# Patient Record
Sex: Male | Born: 1950 | Race: White | Hispanic: No | Marital: Married
Health system: Midwestern US, Academic
[De-identification: ages and names within clinical notes are randomized; demographics above are authoritative.]

---

## 2007-07-30 NOTE — Unmapped (Signed)
[facility]     PATIENT NAME:   John Chapman, John Chapman                     MR #:  [id number]   DATE OF BIRTH:  03/08/1950                        ACCOUNT #:  [id number]   ED PHYSICIAN:   Martie Lee D. Etheleen Mayhew, M.D.            ROOM #:   PRIMARY:        Waverly Ferrari. Laurine Blazer, M.D.           NURSING UNIT:  ED   REFERRING:                                        FC:  C   DICTATED BY:    Martie Lee D. Etheleen Mayhew, M.D.            ADMIT DATE:  07/30/2007   VISIT DATE:     07/30/2007                        DISCHARGE DATE:                       EMERGENCY DEPARTMENT 24 HOUR FOLLOWUP NOTE       CHIEF COMPLAINT:  Rash.     HISTORY OF PRESENT ILLNESS:  The patient is a pleasant, 57 year old,   Caucasian male who presents to the ED complaining of a pruritic rash that   began shortly after he took a dose of amoxicillin this morning.  He has   recently been on amoxicillin for URI.  He took his last dose this morning   around 3:00 when he awoke and then shortly afterwards he broke out in hives.   He has never been on amoxicillin before that he recalls.     PAST MEDICAL HISTORY:     1.   Hypercholesterolemia.   2.   Gout.     MEDICATIONS:     1.   Lipitor.   2.   Amoxicillin.     ALLERGIES:     1.   Probably allergic to amoxicillin from the rash this morning.     SOCIAL HISTORY:  The patient does not smoke or drink any alcohol.  No   recreational drugs.  He is married.     FAMILY HISTORY:  Noncontributory.     REVIEW OF SYSTEMS:  As above.  The patient denies any shortness of breath or   wheezing.  No facial swelling or difficulty swallowing.  Other review of   systems are negative.   PHYSICAL EXAMINATION:     VITAL SIGNS:  Blood pressure 146/68, pulse 65, respirations 16, temperature,   please see nursing notes.   GENERAL:  This is a pleasant, ___, white male lying down who is awake and   alert.   SKIN:  Warm and dry.   RESPIRATORY:  No obvious respiratory distress.  There is no wheezing.   SKIN:  He does have a diffuse  urticarial rash most significantly on his   chest, abdomen and forearms.   HEENT:  PERRL.  EOMI.  Oropharynx is moist.  There is no swelling.   NECK:  Supple.  No JVD.  CARDIOVASCULAR:  Regular rate and rhythm.  S1 and S2, without murmur, rub or   gallop.   ABDOMEN:  Positive bowel sounds, soft, nontender.   EXTREMITIES:  Without edema.  Good distal pulses.   NEUROLOGIC:  Cranial nerves II through XII are intact.  Strength is 5/5 and   equal.  Nonfocal.     EMERGENCY DEPARTMENT COURSE:  In the emergency room, an IV was started.  The   patient received 50 mg of Benadryl IV as well as 120 mg Solu-Medrol and 20 mg   of Pepcid.  The patient will be monitored here in the ED to see if symptoms   improve with that.  If they do improve, I anticipate the patient will be able   to be discharged home on Benadryl as well as prednisone and Pepcid.  The   patient and his wife are planning on driving down to [place] today.  I have   informed him that he should not be driving if he is taking Benadryl.  His   wife states that she will be driving.  If he is to get worse, he is to be   evaluated at that time.  At the present time he does not show any signs of   any airway involvement.  He just has the urticarial rash.  I have also   informed him that he needs to let his doctor know and his pharmacy that he is   allergic to penicillin.  He agrees and understands.     DISPOSITION:  Home.     CONDITION:  Stable.                                                   _______________________________________   SDL/dla                                _____   D:  07/30/2007 07:13                  Augusta Mirkin D. Etheleen Mayhew, M.D.   T:  07/30/2007 12:17   Job #:  [id number]     c:   Waverly Ferrari. Laurine Blazer, M.D.                       EMERGENCY DEPARTMENT 24 HOUR FOLLOWUP NOTE                                        COPY                    PAGE    1 of   1                                        COPY                    PAGE    1 of   1

## 2014-02-04 ENCOUNTER — Ambulatory Visit: Admit: 2014-02-04 | Payer: PRIVATE HEALTH INSURANCE

## 2014-02-04 DIAGNOSIS — I10 Essential (primary) hypertension: Secondary | ICD-10-CM

## 2014-02-04 LAB — BASIC METABOLIC PANEL
Anion Gap: 6 mmol/L (ref 3–16)
BUN: 22 mg/dL (ref 7–25)
CO2: 28 mmol/L (ref 21–33)
Calcium: 9.7 mg/dL (ref 8.6–10.3)
Chloride: 103 mmol/L (ref 98–110)
Creatinine: 1.09 mg/dL (ref 0.60–1.30)
GFR MDRD Af Amer: 83 See note.
GFR MDRD Non Af Amer: 69 See note.
Glucose: 111 mg/dL (ref 70–100)
Osmolality, Calculated: 288 mOsm/kg (ref 278–305)
Potassium: 4.3 mmol/L (ref 3.5–5.3)
Sodium: 137 mmol/L (ref 133–146)

## 2014-02-04 LAB — CBC
Hematocrit: 45.9 % (ref 38.5–50.0)
Hemoglobin: 15.4 g/dL (ref 13.2–17.1)
MCH: 31.9 pg (ref 27.0–33.0)
MCHC: 33.7 g/dL (ref 32.0–36.0)
MCV: 94.8 fL (ref 80.0–100.0)
MPV: 7.2 fL (ref 7.5–11.5)
Platelets: 205 10*3/uL (ref 140–400)
RBC: 4.84 10*6/uL (ref 4.20–5.80)
RDW: 13.1 % (ref 11.0–15.0)
WBC: 5.1 10*3/uL (ref 3.8–10.8)

## 2014-02-04 NOTE — Unmapped (Signed)
Pre-Procedure Instructions    We???re pleased that you have chosen [facility] for your upcoming procedure.  The staff serving you is professionally trained to provide the highest quality care.  We encourage you to ask questions and to let the staff know your special needs.  We want your visit to be as comfortable as possible.    Your procedure is scheduled on 02/06/14 at 0730 AM.  Please arrive at 0530 AM and check in at the surgery waiting room on the second floor of the main hospital.    Directions to Surgery Area- do not stop at [employer] on Main Floor    1) Main entrance of the hospital to the elevators on the left (across from the gift shop)  2) Elevator to the second floor.  3) Left off elevator to the Surgical Services Desk.    FOLLOW YOUR PHYSICIAN'S INSTRUCTIONS CONCERNING DISCONTINUING ASPIRIN, NSAIDS (non-steroidal anti-inflammatories such as Ibuprofen, Advil and Naproxen), SUPPLEMENTS, FISH OIL, VITAMINS, AND HERBAL SUPPLEMENTS.  ACETAMINOPHEN (TYLENOL) IS OK.    ??? DO NOT EAT OR DRINK ANYTHING (including gum, mints, water, etc.) after midnight the night before your procedure.  You may brush your teeth and gargle on the morning of surgery, but do not swallow any water with the exception of the following medication: none (with a small sip of water).    ??? Please make transportation arrangements and bring a responsible adult to accompany you home and remain with you for 24 hours.  ??? We recommend that you leave valuables (i.e. money, jewelry, credit cards) at home.  If you wear glasses or contacts, bring a case for safekeeping.  ??? Wear casual, loose fitting, and comfortable clothing.  A gown will be provided.  If you are staying overnight, bring a small overnight bag.  (Storage space is limited.)  ??? Please remove all makeup, jewelry, body piercings, powder, lotions, perfume/cologne, and nail polish before you arrive.  ??? Bring a list of your medications and dose including herbal.  Do not bring any  pills or medications to the hospital. (Exception: transplant patients.)  ??? Bring a photo ID and your insurance card so we can bill your insurance company directly.  ??? Please do not bring any children under the age of 86 to the hospital.  ??? Do not shave in the area of the surgery for 2 days prior to surgery.  If needed, a trained staff member will clip the area immediately before your surgery.  ??? Quit smoking as far in advance of surgery as possible.  Patients who quit at least 30 days before surgery may have better outcomes.  ??? If you are diabetic, pay close attention to your blood sugar and try to keep it in the range your doctor wants it to be in.    ??? Talk to your doctor about taking medication such as Aspirin, Plavix, Pradaxa or Coumadin before surgery.  ??? If you have a cold or are sick prior to surgery, contact your surgeon before surgery.  ??? Please shower at home the evening before and the morning of surgery using an antibacterial soap.    Special Instructions    1. Please bring Inhalers the day of surgery.  2. Please bring CPAP machine and all associated equipment to the hospital the day of surgery    Hibiclens  You will be provided hibiclens for preop shower.  You will use this soap to your operative site once a day for 3 days prior to surgery  and the morning of surgery.  DO NOT APPLY THIS SOAP ABOVE YOUR NECK.        Antibacterial showering and good hand hygiene are essential to prevent surgical site infections and reduce the spread of MRSA.  Please take a shower the morning of surgery using an antibacterial soap.  Patient verbalized understanding of these instructions.    Make sure all of your health care givers are checking your ID bracelet and verifying your name and date of birth.  You will actively be involved in verifying the type of surgery you are having and the correct site.  Your health care givers should be cleaning their hands with soap and water or antibacterial foam before taking care of you  and if they do not it is ok to remind them to do so.      In an effort to reduce the risks of blood clots after your surgery you will have compression sleeves on your lower legs.  These sleeves help facilitate circulation and decrease the chances of developing any blood clots.       Patient/Family provided education about surgical site infection prevention.    Contact information:    [facility] Pre-admission Testing,  Monday - Friday 8:00 am - 4:30 pm,   [phone].    If you need to reach someone outside of regular business hours regarding your surgery please call your surgeon or   [facility] at [phone].

## 2014-02-04 NOTE — Unmapped (Signed)
PRE-OPERATIVE HISTORY AND PHYSICAL       Subjective:      [facility] NP / PA:   Verta Ellen, PA    Date of Surgery:  02/06/14  Surgeon:  Dr. Wendi Maya  Diagnosis:  Left achilles tendon rupture, haglunds deformity  Procedure:  Left achilles tendon repair, posterior spur resection of left heel, left gastrocnemius recession, possible left flexor hallucis longus tendon transfer    Patient ID: John Chapman is a 64 y.o. male.    Patient is being seen today at the request of Dr. Wendi Maya to render an opinion on perioperative risk optimization and to coordinate medical care as necessary prior to the following procedure:  Left achilles tendon repair, posterior spur resection of left heel, left gastrocnemius recession, possible left flexor hallucis longus tendon transfer.    Chief Complaint   Patient presents with   ??? Pre-op Exam       History of Present Illness:    Patient is a 64 year old male who c/o chronic pain of left foot for the past couple years, which he states has become progressively worse in the past year.  He states he was actually starting to feel better when achilles tendon ruptured. He c/o constant aching pain.   He states currently pain is 3/10 in severity.         Chronic Medical Conditions, Severity, Optimization:    1. HTN: on lisinopril/HTCZ.  BP today 156/95.  2. HLD: on statin.    3. OSA: uses CPAP nightly.      [facility] Activity Scale:  5 - Walking four miles per hour; social dancing; washing a car.       The following portions of the patient's history were reviewed and updated as appropriate: allergies, current medications, past family history, past medical history, past social history, past surgical history and problem list.    Medical History:     Past Medical History   Diagnosis Date   ??? Hypertension    ??? HLD (hyperlipidemia)    ??? OSA (obstructive sleep apnea)        Surgical History:     Past Surgical History   Procedure Laterality Date   ??? Biceps rupture repair     ??? Knee surgery         Family History:          Family History   Problem Relation Age of Onset   ??? Stroke Mother    ??? Coronary artery disease Brother    ??? Stroke Maternal Grandmother        Social History:     History     Social History   ??? Marital Status: Married     Spouse Name: N/A     Number of Children: N/A   ??? Years of Education: N/A     Occupational History   ??? Not on file.     Social History Main Topics   ??? Smoking status: Current Some Day Smoker     Types: Cigars   ??? Smokeless tobacco: Not on file      Comment: cigar every couple months   ??? Alcohol Use: Yes      Comment: average about 1 drink per day   ??? Drug Use: No   ??? Sexual Activity: Not on file     Other Topics Concern   ??? Not on file     Social History Narrative   ??? No narrative on file  Allergies:     Allergies   Allergen Reactions   ??? Penicillins        Medications:     Prior to Admission medications taking for visit date 02/04/14   Medication Sig Taking? Authorizing Provider   allopurinol (ZYLOPRIM) 300 MG tablet Take 300 mg by mouth daily. Yes Historical Provider, MD   ascorbic acid (VITAMIN C) 1000 MG tablet Take 1,000 mg by mouth daily. Yes Historical Provider, MD   aspirin 81 MG EC tablet Take 81 mg by mouth daily. Yes Historical Provider, MD   b complex vitamins capsule Take 1 capsule by mouth daily. Yes Historical Provider, MD   glucosamine-chondroitin 500-400 mg tablet Take 1 tablet by mouth 3 times a day. Yes Historical Provider, MD   lisinopril-hydrochlorothiazide (PRINZIDE,ZESTORETIC) 10-12.5 mg per tablet Take 1 tablet by mouth daily. Yes Historical Provider, MD   multivitamin [facility]) tablet Take 1 tablet by mouth daily. Yes Historical Provider, MD   naproxen sodium (ANAPROX) 220 MG tablet Take 220 mg by mouth 2 times a day with meals. Yes Historical Provider, MD   simvastatin (ZOCOR) 80 MG tablet Take 80 mg by mouth at bedtime. Yes Historical Provider, MD          Review of Systems   Constitutional: Negative for fever, chills, weight loss and weight gain.   HENT: Negative  for hearing loss and trouble swallowing.    Eyes: Negative for photophobia, pain, redness and itching.        C/o chronic floaters   Respiratory: Negative for cough, shortness of breath and wheezing.         Denies orthopnea.    Cardiovascular: Negative for chest pain, palpitations and leg swelling.        Activity:  Pt states he is able to climb two flights of stairs without chest pain or SOB.    Gastrointestinal: Negative for heartburn, nausea, vomiting and abdominal pain.   Musculoskeletal: Positive for back pain (chron\\ic lower back pain) and joint swelling (left foot/ankle since injury). Negative for neck pain.   Skin: Positive for rash (rash of right hand, chronic). Negative for wound.   Neurological: Positive for dizziness (sometimes with standing up too quickly). Negative for syncope, weakness and numbness.        Denies hx of CVA or TIA.    Hematological: Does not bruise/bleed easily.        Denies hx of DVT or PE.        Objective:   Blood pressure 156/95, pulse 60, temperature 97.3 ??F (36.3 ??C), temperature source Oral, resp. rate 16, height 6' (1.829 m), weight 287 lb 6 oz (130.352 kg), SpO2 99 %.    Physical Exam   Vitals reviewed.  Constitutional: He is oriented to person, place, and time. He appears well-developed and well-nourished. No distress.   Body mass index is 38.97 kg/(m^2).    HENT:   Head: Normocephalic and atraumatic.   Right Ear: Hearing and external ear normal.   Left Ear: Hearing and external ear normal.   Nose: Nose normal.   Mouth/Throat: Uvula is midline, oropharynx is clear and moist and mucous membranes are normal.   Eyes: Conjunctivae, EOM and lids are normal. Pupils are equal, round, and reactive to light. No scleral icterus.   Neck: Normal range of motion. Neck supple. No JVD present. Carotid bruit is not present. No tracheal deviation present. No thyroid mass and no thyromegaly present.   Cardiovascular: Normal rate, regular rhythm, S1 normal, S2 normal  and normal heart sounds.   Exam reveals no gallop and no friction rub.    No murmur heard.  Pulses:       Carotid pulses are 2+ on the right side, and 2+ on the left side.  Pulmonary/Chest: Effort normal and breath sounds normal. No stridor. No apnea. No respiratory distress. He has no wheezes. He has no rhonchi. He has no rales. He exhibits no tenderness.   Abdominal: Soft. Bowel sounds are normal. He exhibits no distension and no mass. There is no tenderness. There is no rebound and no guarding.   Musculoskeletal: Normal range of motion. He exhibits edema (1+ pitting edema bilateral lower extremities). He exhibits no tenderness.   Lymphadenopathy:     He has no cervical adenopathy.   Neurological: He is alert and oriented to person, place, and time. He has normal strength. He displays no tremor. No cranial nerve deficit or sensory deficit. He exhibits normal muscle tone. Coordination and gait normal.   Skin: Skin is warm and dry. No petechiae, no purpura and no rash noted. He is not diaphoretic. No cyanosis or erythema. No pallor. Nails show no clubbing.   Psychiatric: He has a normal mood and affect. His speech is normal and behavior is normal. Judgment and thought content normal.       Airway:  Mallampati III (soft and hard palate and base of uvula visible), Thyromental distance 3 finger breadths, opening 3 finger breadths.  Dentition intact; no chipped or loose teeth.  Good neck ROM.        Lab Review:   Reviewed labs from 10/2013 in [facility]: CBC, diff, BMP, LFT.      Study Results:   ECG today.       ASA Physical Status:   ASA Physical Status:  2       Assessment and Recommendations:     Patient is a 64 year old male who is seen prior to Left achilles tendon repair, posterior spur resection of left heel, left gastrocnemius recession, possible left flexor hallucis longus tendon transfer under [place].  Current medical conditions include:    1. Cardiac risk:  Pt denies hx of CAD/MI or CHF.  He states he was told he had RBBB on ECG done  for a previous surgery several years ago.  He reports good exercise tolerance as he climbs two flights of stairs without difficulty.  He denies any chest pain, SOB, or orthopnea.  ECG today.  RCRI score of 0 in pt with good functional status and asymptomatic.  No further testing recommended at this time.     2. HTN: on lisinopril/HTCZ, which he was instructed to hold on morning of surgery.  BP today 156/95.    3. HLD: on statin.  Will continue perioperatively.     4. OSA: uses CPAP nightly.  Instructed pt to bring CPAP to hospital on day of surgery.     Verta Ellen, PA-C

## 2014-02-06 MED ORDER — lactated ringers infusion
INTRAVENOUS | Status: AC
Start: 2014-02-06 — End: 2014-02-06
  Administered 2014-02-06: 13:00:00 via INTRAVENOUS

## 2014-02-06 MED ORDER — propofol 10 mg/ml (DIPRIVAN) 10 mg/mL injection
10 | INTRAVENOUS | Status: AC
Start: 2014-02-06 — End: ?

## 2014-02-06 MED ORDER — fentaNYL (SUBLIMAZE) injection 12.5 mcg
50 | INTRAMUSCULAR | Status: AC | PRN
Start: 2014-02-06 — End: 2014-02-06

## 2014-02-06 MED ORDER — lactated ringers infusion
Freq: Once | INTRAVENOUS | Status: AC
Start: 2014-02-06 — End: 2014-02-06
  Administered 2014-02-06: 12:00:00 50 mL/h via INTRAVENOUS

## 2014-02-06 MED ORDER — fentaNYL (SUBLIMAZE) 50 mcg/mL injection
50 | INTRAMUSCULAR | Status: AC
Start: 2014-02-06 — End: ?

## 2014-02-06 MED ORDER — sodium chloride, irrigation 0.9 % irrigation
0.9 | Status: AC | PRN
Start: 2014-02-06 — End: 2014-02-06
  Administered 2014-02-06: 14:00:00 1000

## 2014-02-06 MED ORDER — sodium chloride 0.9 % infusion
INTRAVENOUS | Status: AC
Start: 2014-02-06 — End: ?

## 2014-02-06 MED ORDER — HYDROmorphone (DILAUDID) injection Syrg 0.2 mg
0.5 | INTRAMUSCULAR | Status: AC | PRN
Start: 2014-02-06 — End: 2014-02-06

## 2014-02-06 MED ORDER — meperidine (PF) (DEMEROL) 25 mg/mL Syrg 12.5 mg
25 | INTRAMUSCULAR | Status: AC | PRN
Start: 2014-02-06 — End: 2014-02-06

## 2014-02-06 MED ORDER — lidocaine (PF) (XYLOCAINE) 10 mg/mL (1 %) Soln
10 | INTRAMUSCULAR | Status: AC
Start: 2014-02-06 — End: 2014-02-06

## 2014-02-06 MED ORDER — nalOXone (NARCAN) injection 0.04 mg
0.4 | INTRAMUSCULAR | Status: AC | PRN
Start: 2014-02-06 — End: 2014-02-06

## 2014-02-06 MED ORDER — HYDROmorphone (DILAUDID) injection Syrg 0.4 mg
0.5 | INTRAMUSCULAR | Status: AC | PRN
Start: 2014-02-06 — End: 2014-02-06

## 2014-02-06 MED ORDER — HYDROmorphone (DILAUDID) injection Syrg 0.6 mg
1 | INTRAMUSCULAR | Status: AC | PRN
Start: 2014-02-06 — End: 2014-02-06

## 2014-02-06 MED ORDER — midazolam (PF) (VERSED) 1 mg/mL injection
1 | INTRAMUSCULAR | Status: AC
Start: 2014-02-06 — End: ?

## 2014-02-06 MED ORDER — clindamycin (CLEOCIN) IVPB 900 mg
900 | Freq: Once | INTRAVENOUS | Status: AC
Start: 2014-02-06 — End: 2014-02-06
  Administered 2014-02-06: 13:00:00 900 mg via INTRAVENOUS

## 2014-02-06 MED ORDER — fentaNYL (SUBLIMAZE) injection 50 mcg
50 | INTRAMUSCULAR | Status: AC | PRN
Start: 2014-02-06 — End: 2014-02-06

## 2014-02-06 MED ORDER — ondansetron (ZOFRAN) 4 mg/2 mL injection 4 mg
4 | Freq: Three times a day (TID) | INTRAMUSCULAR | Status: AC | PRN
Start: 2014-02-06 — End: 2014-02-06

## 2014-02-06 MED ORDER — phenylephrine (NEO-SYNEPHRINE) 10 mg/mL injection
10 | INTRAMUSCULAR | Status: AC
Start: 2014-02-06 — End: ?

## 2014-02-06 MED ORDER — fentaNYL (SUBLIMAZE) injection
50 | INTRAMUSCULAR | Status: AC | PRN
Start: 2014-02-06 — End: 2014-02-06
  Administered 2014-02-06 (×2): 50 via INTRAVENOUS

## 2014-02-06 MED ORDER — lidocaine (PF) 2% (20 mg/mL) 20 mg/mL (2 %) Soln
20 | INTRAMUSCULAR | Status: AC
Start: 2014-02-06 — End: ?

## 2014-02-06 MED ORDER — oxyCODONE-acetaminophen (PERCOCET) 5-325 mg per tablet
5-325 | ORAL_TABLET | ORAL | Status: AC | PRN
Start: 2014-02-06 — End: ?

## 2014-02-06 MED ORDER — promethazine (PHENERGAN) injection 6.25 mg
25 | Freq: Four times a day (QID) | INTRAMUSCULAR | Status: AC | PRN
Start: 2014-02-06 — End: 2014-02-06

## 2014-02-06 MED ORDER — bupivacaine (MARCAINE) 0.75 % (7.5 mg/mL) injection
0.75 | INTRAMUSCULAR | Status: AC | PRN
Start: 2014-02-06 — End: 2014-02-06
  Administered 2014-02-06: 13:00:00 2 via INTRATHECAL

## 2014-02-06 MED ORDER — propofol 10 mg/ml (DIPRIVAN) INFUSION 200 mg
INTRAVENOUS | Status: AC | PRN
Start: 2014-02-06 — End: 2014-02-06
  Administered 2014-02-06: 13:00:00 50 mg via INTRAVENOUS

## 2014-02-06 MED ORDER — bupivacaine (MARCAINE) 0.5 % (5 mg/mL) injection
0.5 | INTRAMUSCULAR | Status: AC | PRN
Start: 2014-02-06 — End: 2014-02-06
  Administered 2014-02-06: 14:00:00 20 via SUBCUTANEOUS

## 2014-02-06 MED ORDER — succinylcholine (QUELICIN) 20 mg/mL injection
20 | INTRAMUSCULAR | Status: AC
Start: 2014-02-06 — End: ?

## 2014-02-06 MED ORDER — rocuronium (ZEMURON) 10 mg/mL injection
10 | INTRAVENOUS | Status: AC
Start: 2014-02-06 — End: ?

## 2014-02-06 MED ORDER — bupivacaine (PF) (SENSORCAINE/MARCAINE) 0.5 % (5 mg/mL) Soln
0.5 | INTRAMUSCULAR | Status: AC
Start: 2014-02-06 — End: 2014-02-06

## 2014-02-06 MED ORDER — midazolam (PF) (VERSED) injection
1 | INTRAMUSCULAR | Status: AC | PRN
Start: 2014-02-06 — End: 2014-02-06
  Administered 2014-02-06: 13:00:00 2 via INTRAVENOUS

## 2014-02-06 MED ORDER — fentaNYL (SUBLIMAZE) injection 25 mcg
50 | INTRAMUSCULAR | Status: AC | PRN
Start: 2014-02-06 — End: 2014-02-06

## 2014-02-06 MED FILL — BUPIVACAINE (PF) 0.5 % (5 MG/ML) INJECTION SOLUTION: 0.5 0.5 % (5 mg/mL) | INTRAMUSCULAR | Qty: 30

## 2014-02-06 MED FILL — LACTATED RINGERS INTRAVENOUS SOLUTION: 50.00 50.00 mL/hr | INTRAVENOUS | Qty: 1000

## 2014-02-06 MED FILL — QUELICIN 20 MG/ML INJECTION SOLUTION: 20 20 mg/mL | INTRAMUSCULAR | Qty: 10

## 2014-02-06 MED FILL — LIDOCAINE (PF) 20 MG/ML (2 %) INJECTION SOLUTION: 20 20 mg/mL (2 %) | INTRAMUSCULAR | Qty: 10

## 2014-02-06 MED FILL — PROPOFOL 10 MG/ML INTRAVENOUS EMULSION: 10 10 mg/mL | INTRAVENOUS | Qty: 60

## 2014-02-06 MED FILL — FENTANYL (PF) 50 MCG/ML INJECTION SOLUTION: 50 50 mcg/mL | INTRAMUSCULAR | Qty: 2

## 2014-02-06 MED FILL — ROCURONIUM 10 MG/ML INTRAVENOUS SOLUTION: 10 10 mg/mL | INTRAVENOUS | Qty: 5

## 2014-02-06 MED FILL — MIDAZOLAM (PF) 1 MG/ML INJECTION SOLUTION: 1 1 mg/mL | INTRAMUSCULAR | Qty: 2

## 2014-02-06 MED FILL — CLEOCIN 900 MG/50 ML IN 5 % DEXTROSE INTRAVENOUS PIGGYBACK: 900 900 mg/50 mL | INTRAVENOUS | Qty: 50

## 2014-02-06 MED FILL — SODIUM CHLORIDE 0.9 % INTRAVENOUS SOLUTION: INTRAVENOUS | Qty: 600

## 2014-02-06 MED FILL — PHENYLEPHRINE 10 MG/ML INJECTION SOLUTION: 10 10 mg/mL | INTRAMUSCULAR | Qty: 1

## 2014-02-06 MED FILL — LIDOCAINE (PF) 10 MG/ML (1 %) INJECTION SOLUTION: 10 10 mg/mL (1 %) | INTRAMUSCULAR | Qty: 30

## 2014-02-06 NOTE — Unmapped (Addendum)
[facility]    Discharge Instructions for Foot Surgery  Also Follow Instructions Dispensed by Dr. Wendi Maya  Keep dressing clean, dry, and intact  Nonweightbearing left foot    What You Will Need   Crutches or a wheelchair, as prescribed by your doctor   Steps to Take   Home Care    At home, take the following steps:   ?? Do deep breathing, coughing, and leg exercises as directed by your doctor or nurse. These will help you avoid getting a lung infection or a blood clot in your legs. Do these for 1-2 days after surgery.    ?? Elevate your foot.   Note:  You may notice a tingling (???pins and needles??? feeling) in your feet when you sit or stand after keeping your foot up for a while. This is normal and will go away when you elevate your foot. You can expect this to happen less and less as you continue healing.    ?? You may be in a lot of pain after the surgery. Take pain medicines as directed.    Diet    To help with the healing process, be sure to eat a balanced diet.   You may be prescribed pain medicine. Some pain medicines can cause constipation. To avoid this problem:   ?? Drink plenty of fluids.    ?? Eat foods high in fiber, such as:   ?? Whole grain cereals and breads   ?? Fruits and vegetables   ?? Legumes (eg, beans, lentils)   Physical Activity    Carefully follow your doctor's instructions about physical activity. If you try to be active too soon, you could increase your risk of complications, such as getting an infection or slowing the healing process.   You will continue to wear a cast at home while your foot is healing. For the first 4-6 weeks, you will need to use a wheelchair or crutches. Keep your weight off your foot during this time.     Medications    Your doctor may prescribe:   ?? Antibiotics--To avoid getting an infection, take all of your antibiotics as directed.    ?? Pain medicine      If you had to stop medicines before the surgery, ask your doctor when you can start again. Medicines  often stopped include:   ?? Anti-inflammatory drugs (eg, aspirin)    ?? Blood thinners, like clopidogrel (Plavix) or warfarin (Coumadin)      When taking medicine, it is important that you:   ?? Do not stop taking your medicines without asking your doctor.    ?? Know what the common side effects of your medicines are. Tell your doctor if you have them.    ?? Some drugs can be dangerous when mixed. Talk to a doctor or pharmacist if you are taking more than one drug. This includes over-the-counter medicine and herb or dietary supplements.    ?? Never drink alcohol after you take your pain medicines.    ?? Plan ahead for medicine refills so you do not run out.    Lifestyle Changes    It may be 3-4 months before you can return to your regular activities. After your foot has healed, your doctor may recommend that you wear special shoes. Also, if you played high-impact sports before the surgery, you may not be able to play these sports anymore, since you will no longer have full mobility in your foot.  Follow-up   Go to all of your appointments with your doctor and physical therapist.   Call Your Doctor If Any of the Following Occurs   It is important for you to monitor your recovery once you leave the hospital. That way, you can alert your doctor to any problems right away. If any of the following occur, call your doctor:   ?? Bleeding or discharge from the incisions--This may show up as staining on your cast.    ?? Signs of infection (eg, fever and chills)    ?? Increasing or severe pain that is not relieved by pain medicine    ?? Cough, shortness of breath, chest pain, or severe nausea and vomiting    ?? Numbness, tingling, or discoloration in the foot    ?? Any other concerns      If you think you have an emergency,  CALL 911  right away.     John Chapman   Sedation or General Anesthesia, Adult  Care After  Refer to this sheet in the next 24 hours. These instructions provide you with information on caring for yourself after  your procedure. Your caregiver may also give you more specific instructions. Your treatment has been planned according to current medical practices, but problems sometimes occur. Call your caregiver if you have any problems or questions after your procedure.   HOME CARE INSTRUCTIONS   ?? Do not participate in any activities that require you to be alert or coordinated. Do not:  ?? Drive.  ?? Swim.  ?? Ride a bicycle.  ?? Operate heavy machinery.  ?? Cook.  ?? Use power tools.  ?? Climb ladders.  ?? Work at [employer].  ?? Take a bath.  ?? Do not drink alcohol.  ?? Do not make any important decisions or sign legal documents.  ?? Stay with an adult.  ?? The first meal following your procedure should be light and small. Avoid solid foods if you feel sick to your stomach (nauseous) or if you throw up (vomit).  ?? Drink enough fluids to keep your urine clear or pale yellow.  ?? Only take your usual medicines or new medicines if your caregiver approves them.  ?? Only take over-the-counter or prescription medicines for pain, discomfort, or fever as directed by your caregiver.  ?? Keep all follow-up appointments as directed by your caregiver.  SEEK IMMEDIATE MEDICAL CARE IF:   ?? You are not feeling normal or behaving normally after 24 hours.  ?? You have persistent nausea and vomiting.  ?? You are unable to drink fluids or eat food.  ?? You have difficulty urinating.  ?? You have difficulty breathing or speaking.  ?? You have blue or gray skin.  ?? There is difficulty waking or you cannot be woken up.  ?? You have heavy bleeding, redness, or a lot of swelling where the sedative or anesthesia entered your skin (intravenous site).  ?? You have a rash.  MAKE SURE YOU:  ?? Understand these instructions.  ?? Will watch your condition.  ?? Will get help right away if you are not doing well or get worse.  Document Released: 01/11/2005 Document Revised: 07/13/2011 Document Reviewed: 05/12/2011  ExitCare?? Patient Information ??2014 [place], [place].

## 2014-02-06 NOTE — Unmapped (Addendum)
ACHILLES TENDON REPAIR, LEFT, , POSTERIOR SPUR RESECTION LEFT HEEL;, GASTROCNEMIUS RECESSION LEFT  Procedure Note    John Chapman  02/06/2014      Pre-op Diagnosis: 1. ACHILLES TENDON RUPTURE LEFT;   2. HAGLUNDS DEFORMITY LEFT       Post-op Diagnosis: same    Procedure(s):  1. ACHILLES TENDON REPAIR, LEFT,   2. POSTERIOR SPUR RESECTION LEFT HEEL;  3. GASTROCNEMIUS RECESSION LEFT  4. Application of posterior splint left      Surgeon(s):  Marisue Brooklyn, DPM   Assistant: Clydene Pugh DPM    Anesthesia: spinal    Staff:   Circulator: Carolynne Edouard, RN  Relief Scrub: Elton Sin, CSA  Scrub Person: Ron Parker  Float: Burman Blacksmith, CSA  Resident: Boykin Nearing, DPM; Carole Binning, DPM  PCA: Etter Sjogren    Estimated Blood Loss: Minimal                 Specimens: * No specimens in log *           Drains:             There were no complications unless listed below.        Marisue Brooklyn     Date: 02/06/2014  Time: 9:11 AM

## 2014-02-06 NOTE — Unmapped (Signed)
INTRA-OP POST BRIEFING NOTE: John Chapman      Specimens:     Prior to leaving the room: Nurse confirmed name of procedure, completion of instrument, sponge & needle counts, reads specimen labels aloud including patient name and addresses any equipment issues? Nurse confirmed wound class. Nurse to surgeon and anesthesia: What are key concerns for recovery and management of the patient?  Yes      Blood products stored at appropriate temperatures prior to return to blood bank (if applicable)? N/A      Other Comments:     Signed: Jaspreet Hollings    Date: 02/06/2014    Time: 9:16 AM

## 2014-02-06 NOTE — Unmapped (Addendum)
[place]  DEPARTMENT OF ANESTHESIOLOGY  PRE-PROCEDURAL EVALUATION    John Chapman is a 64 y.o. year old male presenting for:    Procedure(s):  ACHILLES TENDON REPAIR, LEFT,   POSTERIOR SPUR RESECTION LEFT HEEL;  GASTROCNEMIUS RECESSION LEFT; POSSIBLE FLEXOR HALLUCIS LONGUS TENDON TRANSFER LEFT    Surgeon:   Marisue Brooklyn, DPM    Chief Complaint     <principal problem not specified>    Review of Systems     Anesthesia Evaluation    Patient summary reviewed.       I have reviewed the History and Physical Exam, any relevant changes are noted in the anesthesia pre-operative evaluation.      Cardiovascular:    Hypertension is.    ECG reviewed.    Neuro/Muscoloskeletal/Psych:        Pulmonary:    Sleep apnea.    ROS comment: smoker      GI/Hepatic/Renal:        Endo/Other:          Past Medical History     Past Medical History   Diagnosis Date   ??? Hypertension    ??? HLD (hyperlipidemia)    ??? OSA (obstructive sleep apnea)        Past Surgical History     Past Surgical History   Procedure Laterality Date   ??? Biceps rupture repair     ??? Knee surgery         Family History     Family History   Problem Relation Age of Onset   ??? Stroke Mother    ??? Coronary artery disease Brother    ??? Stroke Maternal Grandmother        Social History     History     Social History   ??? Marital Status: Married     Spouse Name: N/A     Number of Children: N/A   ??? Years of Education: N/A     Occupational History   ??? Not on file.     Social History Main Topics   ??? Smoking status: Current Some Day Smoker     Types: Cigars   ??? Smokeless tobacco: Not on file      Comment: cigar every couple months   ??? Alcohol Use: Yes      Comment: average about 1 drink per day   ??? Drug Use: No   ??? Sexual Activity: Not on file     Other Topics Concern   ??? Not on file     Social History Narrative       Medications     Allergies:  Allergies   Allergen Reactions   ??? Penicillins        Home Meds:  Prior to Admission medications as of 02/04/14 1509   Medication Sig Taking?    allopurinol (ZYLOPRIM) 300 MG tablet Take 300 mg by mouth daily. Yes   ascorbic acid (VITAMIN C) 1000 MG tablet Take 1,000 mg by mouth daily. Yes   aspirin 81 MG EC tablet Take 81 mg by mouth daily. Yes   b complex vitamins capsule Take 1 capsule by mouth daily. Yes   glucosamine-chondroitin 500-400 mg tablet Take 1 tablet by mouth 3 times a day. Yes   lisinopril-hydrochlorothiazide (PRINZIDE,ZESTORETIC) 10-12.5 mg per tablet Take 1 tablet by mouth daily. Yes   melatonin-pyridoxine, vit B6, (MELATONIN, WITH B6,) 5-1 mg Tab Take 5 mg by mouth as needed. Yes   multivitamin (THERAGRAN) tablet Take 1 tablet  by mouth daily. Yes   naproxen sodium (ANAPROX) 220 MG tablet Take 220 mg by mouth 2 times a day with meals. Yes   NON FORMULARY  Yes   simvastatin (ZOCOR) 80 MG tablet Take 80 mg by mouth at bedtime. Yes       Inpatient Meds:  Scheduled:   ??? bupivacaine (PF)       ??? clindamycin in 5 % dextrose  900 mg Intravenous Once   ??? lidocaine (PF)         Continuous:   ??? lactated ringers         PRN: fentaNYL **OR** fentaNYL **OR** fentaNYL, HYDROmorphone **OR** HYDROmorphone **OR** HYDROmorphone, meperidine (PF), nalOXone, ondansetron, promethazine    Vital Signs     Wt Readings from Last 3 Encounters:   02/06/14 278 lb (126.1 kg)   02/04/14 287 lb 6 oz (130.352 kg)     Ht Readings from Last 3 Encounters:   02/06/14 6' (1.829 m)   02/04/14 6' (1.829 m)     Temp Readings from Last 3 Encounters:   02/06/14 98.2 ??F (36.8 ??C) Temporal   02/04/14 97.3 ??F (36.3 ??C) Oral     BP Readings from Last 3 Encounters:   02/06/14 147/78   02/04/14 156/95     Pulse Readings from Last 3 Encounters:   02/06/14 67   02/04/14 60     SpO2 Readings from Last 3 Encounters:   02/06/14 91%   02/04/14 99%       Physical Exam     Airway:     Mallampati: II    Dental:        Pulmonary:   - normal exam     Cardiovascular:  - normal exam     Neuro/Musculoskeletal/Psych:      Abdominal:     Obese.    Current OB Status:       Other Findings:         Laboratory Data     Lab Results   Component Value Date    WBC 5.1 02/04/2014    HGB 15.4 02/04/2014    HCT 45.9 02/04/2014    MCV 94.8 02/04/2014    PLT 205 02/04/2014       No results found for: [facility]    Lab Results   Component Value Date    GLUCOSE 111* 02/04/2014    BUN 22 02/04/2014    CO2 28 02/04/2014    CREATININE 1.09 02/04/2014    K 4.3 02/04/2014    NA 137 02/04/2014    CL 103 02/04/2014    CALCIUM 9.7 02/04/2014       No results found for: PTT, INR    No results found for: PREGTESTUR, PREGSERUM, HCG, HCGQUANT    Anesthesia Plan     ASA 2     Anesthesia Type:  spinal.   (Pt prefers spinal)    Anesthetic plan and risks discussed with patient.          Plan discussed with CRNA.

## 2014-02-06 NOTE — Unmapped (Signed)
Anesthesia Post Note    Patient: John Chapman    Procedure(s) Performed: Procedure(s):  ACHILLES TENDON REPAIR, LEFT,   POSTERIOR SPUR RESECTION LEFT HEEL;  GASTROCNEMIUS RECESSION LEFT    Anesthesia type: spinal    Patient location: Same Day Surgery    Post pain: Adequate analgesia    Post assessment: no apparent anesthetic complications and tolerated procedure well    Last Vitals:   Filed Vitals:    02/06/14 0940   BP: 127/76   Pulse: 52   Temp:    Resp: 17   SpO2: 98%       Post vital signs: stable    Level of consciousness: awake, alert  and oriented    Complications: None

## 2014-02-06 NOTE — Unmapped (Signed)
Patient left c-pap at home.  Anesthesia aware.

## 2014-02-06 NOTE — Unmapped (Signed)
Anesthesia Transfer of Care Note    Patient: Colie Fugitt  Procedure(s) Performed: Procedure(s):  ACHILLES TENDON REPAIR, LEFT,   POSTERIOR SPUR RESECTION LEFT HEEL;  GASTROCNEMIUS RECESSION LEFT    Patient location: Same Day Surgery    Post pain: Adequate analgesia    Post assessment: no apparent anesthetic complications, tolerated procedure well and no evidence of recall    Post vital signs:    Filed Vitals:    02/06/14 0933   BP: 107/70   Pulse: 45   Temp: 97   Resp: 12   SpO2: 94%       Level of consciousness: awake, alert  and oriented    Complications: None

## 2014-02-06 NOTE — Unmapped (Addendum)
Spinal      Reason for block: primary anesthetic      Patient location during procedure: OR  Performed intraoperatively.                Patient position: sitting    Preanesthetic Checklist  Completed: patient identified, anesthesia consent given, pre-op evaluation, timeout performed, IV checked, risks and benefits discussed, monitors and equipment checked/attached to patient, patient being monitored and hand hygiene performed  Anesthesia risks / alternatives discussed pre-op  Questions answered / anesthesia plan accepted and patient agrees to proceed..    Timeout verification:  correct patient, correct procedure, correct site and allergies reviewed.    Spinal    Prep: ChloraPrep    Pre-procedure sedation:  Midazolam            Approach: midline    Start time: 02/06/2014 7:38 AM    Needle     Needle type: Quincke   No introducer used    Needle gauge: 22 G    Needle length: 3.5 in  Level of placement:  L3-4    Free flow of CSF noted.  Number of attempts:  1                Medications:    Injection time:  02/06/2014 7:39 AM  Medications:  Spinal Bupivacaine 0.75%     Amount:  2 mL                Assessment  Events:  blood not aspirated via needle, injection not painful, no injection resistance, no paresthesia with needle placement, no other event and no positive intravascular test dose                            Block Effect:  Successful                            Staffing  Resident/CRNA: Rubina Basinski J  Performed by: CRNA

## 2014-02-06 NOTE — Unmapped (Signed)
I reviewed the history and physical and examined the patient and found no relevant changes preop.    I reviewed with the patient and/or family the risks, benefits, and alternatives to the procedure, and surgical site was marked.    Marisue Brooklyn, DPM  02/06/2014

## 2014-02-06 NOTE — Unmapped (Signed)
Patient is still numjb from spinal and wife is in room but did not speak to Dr. Wendi Maya so instructions for leg exercises are not in folder and patient states he did not receive any at last appointment.  Will have to ask what he wants him doing.  1022 Dr. Wendi Maya called and no new instructions for leg exercise just NWB as written.  1030 Legs are waking up but cannot resist yet.  No pain.  1135 Legs are pretty strong but sensation is still lacking.  Will give another 15 minutes and try to stand.  1150  Stood but balance is so/so and RLE is tingly.  Wife will assist withn dressing but will give so more time to get normal sensation.  1216 Stands now with good balance and is ready to go.  He is dressed and wife went to get car.

## 2014-02-08 NOTE — Unmapped (Signed)
[place]                              [facility]     PATIENT NAME:   John Chapman, John Chapman                  MRN: [id number]  DATE OF BIRTH:  May 02, 1950                     CSN: [id number]  SURGEON:        Marisue Brooklyn, D.P.M.          ADMIT DATE: 02/06/2014  SERVICE:        Podiatry  DICTATED BY:    Owens Shark, DPM         SURGERY DATE: 02/06/2014                                    OPERATIVE REPORT     SURGEON:  Marisue Brooklyn, D.P.M.     ASSISTANT(S):  Owens Shark, DPM; Verdis Frederickson, DPM     PREOPERATIVE DIAGNOSIS(ES):  1. Haglund's tendon rupture left extremity.  2. Haglund's deformity left.     POSTOPERATIVE DIAGNOSIS(ES):  1. Haglund's tendon rupture left extremity.  2. Haglund's deformity left.     PROCEDURE(S) PERFORMED:  1. Gastrocnemius recession left foot.  2. Achilles tendon repair left foot.  3. Posterior spur resection left heel.  4. Application of posterior splint.     SPECIMEN(S):  None.     ANESTHESIA:  Spinal.     HEMOSTASIS:  Pneumatic thigh tourniquet at 325 mmHg pressure.     ESTIMATED BLOOD LOSS: Minimal.     MATERIAL USED:  Arthrex SpeedBridge.     INDICATION(S):  This is a 64 year old male who presents to [facility] for surgical intervention due to Achilles tendon rupture, a large  bone spur of the left lower extremity occurring approximately 2 weeks ago.  All conservative and surgical options were presented to the patient and the  patient chose to have surgical intervention secondary to the amount of  displacement of the rupture and inability for ambulation.  The nature of the  deformity, the problems, signs and symptoms consistent with preoperative  diagnosis, anticipated procedures and postoperative recovery, risks and  potential complications have been explained to the patient and family in  detail and answered their questions to their satisfaction.     DETAILS OF PROCEDURE(S):  Under mild sedation the patient was brought to  the  operating room and placed on the operating table in the prone position.  A  pneumatic thigh tourniquet was placed around the well-padded left upper  thigh.  The patient did have a spinal performed.  The lower extremity was  then scrubbed, prepped and draped in the usual aseptic manner.     Procedure #1:  Gastrocnemius recession.     Attention was first directed to the posteromedial aspect of the left lower  extremity approximately 2 cm inferior to the gastrocnemius medial muscle  head.  An approximately 2 cm linear incision was made.  Dissection was  carried down through the subcutaneous tissue to the superficial fascia.  Special care was taken to avoid and retract all vital neurovascular  structures.  All venous tributaries were coagulated as encountered.  Dissection bluntly carried down to the deep fascia at which time  a 15 blade  was utilized to make a linear incision through the deep fascia.  Blunt  dissection was made underneath the deep fascia medial and lateral exposing  the tendinous fibers of the gastroc.  A malleable retractor then was placed  protecting the vital neurovascular structures along the posterior calf.  Following this, a 15 blade was then placed and utilized to transect  horizontally the gastroc tendon as pressure was being pulled from the  previous rupture site.  This was done from medial to lateral in completeness  of the gastroc tendon.  This was performed to allow lengthening as there was  a severe degree of shortening seen with preoperative imaging and physical  exam of the Achilles tendon.  Following this, the area was flushed with  copious amounts of normal saline.  Anatomical layered closure was performed  with 3-0 Vicryl and 4-0 Prolene.  The next procedure was carried out.     Procedure #2:  Posterior spur resection left heel.     Attention was now directed to the posterior portion of the Achilles tendon  and calcaneus.  All bony landmarks were identified and drawn out.   A  curvilinear incision was made with approximately 8 cm with the curve at the  posterior calcaneus.  The incision was started medial and then translated  laterally approximately mid calcaneus.  Dissection was then carried out down  to the level of the Achilles tendon with care taken to retract all vital  neurovascular structures.  The paratenon was seen and reflected and tagged  for later reapproximation.  Following this, the tendon rupture was seen and  noticed to have full avulsion retraction of the tendon from the calcaneus  with approximately a 5 cm retraction of the tendon.  The distal aspect of the  tendon was identified, cleared and all adhesions freed from this aspect.  Dissection was then carried down to the calcaneal tuber aspect.  This  dissection was carried out medial and lateral exposing the large posterior  spur of the calcaneus.  The decision was made to resect this area as this was  likely a contributory source causing this type of injury as well as a history  of severe posterior pain.  This would also allow improved outcome of  attachment of the tendon to the calcaneus.  Utilizing a sagittal saw, the  posterior spur was resected as well as the medial and lateral exostoses  resected and passed to the back table.  X-ray was utilized to verify a more  anatomical contour of the calcaneus following spur resection.  Following  this, a power rasp was utilized to smooth all rough or bony aspects as  needed.  The next procedure was then carried out.     Procedure #3:  Achilles tendon repair.     Following bony resection, attention was now directed to repair of the  previously avulsed Achilles tendon rupture.  The tendon was inspected and it  was noted that the distal ~2 cm of the tendon was infarcted and bulbous tissue  that would not be amenable to repair.  The decision was made to resect the  distal ~2cm with a transverse transection as well as resecting and passing  the avulsion fracture that was within  the tendon.  Resection was performed  back to healthy white-appearing bleeding tendon that had strong integrity.  Following this, the tendon was stretched with slight plantarflexion of the  heel to allow the calcaneus to be reapproximated.  Following this, the  decision was made to reattach the Achilles tendon to the calcaneus utilizing  the Arthrex SpeedBridge.  First four drill holes were made within the  posterior tuber, two dorsal and two distal evenly spaced apart.  Each one was  tapped.  The anchors were first placed in the proximal drill holes both of  which had the Arthrex suture tape within.  Utilizing a free needle one suture  from each anchor was passed through the tendon.  The tendon was then pulled  down to proper anatomical placement within the calcaneus with slight  plantarflexion of the heel.  Following this, one suture from each anchor was  matched with the suture from the opposite anchor forming a cross X attachment  on the back.  This was done on the right, then on the left in a similar  pattern.  The suture was then passed through the corresponding anchor and  then under physiological tension the anchor was placed within the inferior  hole of the calcaneus.  This was done the same way on the other side.  Physiological anatomical tension was verified and good tendon to bony  apposition was verified.  The anchor suture that was seen was there and this  was utilized to reinforce the tendon bony repair.  Following this all areas  were inspected and no other injury was seen or noted at this time.  Following  this, the area was flushed with copious amounts of normal saline.  The  paratenon was reapproximated with 4-0 Vicryl.  Following the subcutaneous  tissue was reapproximated with 3-0 Vicryl and the skin was reapproximated  with 4-0 Prolene in a horizontal fashion.  All incisions were well  approximated.     Following this, each incision was covered with Adaptic gauze, Kerlix.  Following this, a  well-padded posterior splint was applied to the left lower  extremity with special care taken to pad all bony landmarks.  The patient was  splinted in slight plantarflexion to allow minimal tension on the tendon as  it heals.     The patient tolerated the procedure and anesthesia well in apparent  satisfactory condition.  The patient was transported to the PACU with vital  signs stable and vascular status intact to all digits.  The patient was  monitored in the PACU during recovery from anesthesia and was discharged home  with all instructions, orders and prescriptions written.  The patient is to  remain nonweightbearing until released by Dr. Wendi Maya.  The patient is to call  Dr. Verita Lamb office if any questions arise.                                           Marisue Brooklyn, D.P.M.  MZ/mjs                                Dictated by:  Owens Shark, DPM  D:  02/08/2014 11:36  T:  02/09/2014 09:49  Job #:  [id number]           OPERATIVE REPORT                                             PAGE  1 of   1

## 2015-11-17 LAB — HEPATIC FUNCTION PANEL
A/G Ratio: 1.8 (ref 1.0–2.1)
ALT: 39 U/L (ref 0–60)
AST: 31 U/L (ref 0–40)
Albumin: 4.4 g/dL (ref 3.5–5.0)
Alkaline Phosphatase: 53 U/L (ref 33–140)
Bilirubin, Direct: 0.3 mg/dL (ref 0.0–0.5)
Globulin, Total: 2.5 g/dL (ref 2.0–3.7)
Total Bilirubin: 0.7 mg/dL (ref 0.2–1.2)
Total Protein: 6.9 g/dL (ref 6.0–8.0)

## 2015-11-17 LAB — LIPID PANEL
Chol/HDL Ratio: 2.4 (ref 0–5)
Cholesterol, Total: 162 mg/dL (ref 125–199)
HDL: 67 mg/dL (ref 40–180)
LDL Cholesterol: 78 mg/dL (ref 0–100)
Triglycerides: 85 mg/dL (ref 0–150)

## 2015-11-17 NOTE — Unmapped (Signed)
Progress Notes by Eda Keys., MD at 11/17/15 0900     Author:  Eda Keys., MD Service:  (none) Author Type:  Physician    Filed:  11/17/15 0911 Encounter Date:  11/17/2015 Status:  Signed    Editor:  Eda Keys., MD (Physician)         John Chapman  12-Sep-1950  Chief Complaint    Patient presents with    ??????? HTN[JS.1]        I had the pleasure of seeing your patient John Chapman in Follow-up in the office on November 17, 2015. As you know, John Chapman is a 65 year old male with a history of hypertension, dyslipidemia and family history of coronary disease who recently   underwent a screening coronary calcium score this past July.[JS.2]   The total[JS.3]  was 130  In the LAD territory.[JS.2]      Overall he's doing well without complaints.  Unfortunately he has gained 7 pounds since our last visit.[JS.3]      Patient's Medications     Current Medications      ALLOPURINOL (ZYLOPRIM) 300 MG TABLET    Take 1 Tab by mouth daily.       Order Dose: 300 mg     ASPIRIN 81 MG TABLET, DELAYED RELEASE (E.C.)    Take 81 mg by mouth daily.       Order Dose: 81 mg     ATORVASTATIN (LIPITOR) 40 MG TABLET    Take 1 Tab by mouth nightly at bedtime.       Order Dose: 40 mg     LISINOPRIL-HYDROCHLOROTHIAZIDE (PRINZIDE, ZESTORETIC) 10-12.5 MG PER TABLET    Take 1 Tab by mouth daily.       Order Dose: 1 Tab     MULTIVITAMIN PO    Take  by mouth daily.       Order Dose: --[JS.1]               Review of Systems   Constitutional: Negative.    HENT: Negative.    Eyes: Negative.    Respiratory: Negative.    Cardiovascular: Negative.    Gastrointestinal: Negative.    Genitourinary: Negative.    Musculoskeletal: Negative.    Skin: Negative.    Neurological: Negative.    Endo/Heme/Allergies: Negative.    Psychiatric/Behavioral: Negative.[DJ.1]                Vitals:     11/17/15 0856   BP: 130/80   Pulse: 58   Weight: 267 lb (121.1 kg)   Height: 5' 11 (1.803 m)     Body mass index is 37.24 kg/m????.[JS.1]    Physical Exam    Constitutional: He is[DJ.1] oriented to person, place, and time[JS.3] and[DJ.1] well-developed, well-nourished, and in no distress[JS.3].   HENT:   Head:[DJ.1] Normocephalic[JS.3] and[DJ.1] atraumatic[JS.3].   Neck:[DJ.1] Normal range of motion[JS.3].[DJ.1] Neck supple[JS.3].[DJ.1] No thyromegaly[JS.3] present.   Cardiovascular:[DJ.1] Normal rate[JS.3],[DJ.1] regular rhythm[JS.3],[DJ.1] normal heart sounds[JS.3] and[DJ.1] intact distal pulses[JS.3].    Pulmonary/Chest:[DJ.1] Effort normal[JS.3] and[DJ.1] breath sounds normal[JS.3]. He has[DJ.1] no wheezes[JS.3]. He has[DJ.1] no rales[JS.3].   Abdominal:[DJ.1] Soft[JS.3].[DJ.1] Bowel sounds are normal[JS.3]. He exhibits[DJ.1] no distension[JS.3]. There is[DJ.1] no tenderness[JS.3].   Musculoskeletal:[DJ.1] Normal range of motion[JS.3]. He exhibits no[DJ.1] edema[JS.3].   Lymphadenopathy:     He has[DJ.1] no cervical adenopathy[JS.3].   Neurological: He is[DJ.1] alert[JS.3] and[DJ.1] oriented to person, place, and time[JS.3].[DJ.1] Gait[JS.3] normal.   Skin: Skin is[DJ.1] warm[JS.3] and[DJ.1] dry[JS.3].   Psychiatric:[DJ.1] Affect[JS.3] normal.  IMPRESSION:[DJ.1]      ICD-9-CM ICD-10-CM    1. Coronary artery disease involving native coronary artery of native heart without angina pectoris 414.01 I25.10    2. Benign essential HTN 401.1 I10    3. Dyslipidemia 272.4 E78.5    4. OSA on CPAP - [ Dr. Clearnce Sorrel, et.al. ] 327.23 G47.33      V46.8 Z99.89    5. Obesity (BMI 30-39.9) 278.00 E66.9[JS.1]            PLAN AND RECOMMENDATION:[DJ.1]    1.  Coronary artery disease: By virtue of a mildly abnormal calcium score of 130.  Continue risk factor modification and medical therapy including aspirin, antihypertensives and a statin.  A lipid profile is pending  2.  Hypertension:[JS.2] He did bring in a log of blood pressures which are controlled at home.  I've made no changes.[JS.3]  3.  Dyslipidemia:[JS.2] He was switched to Lipitor at her last visit.  A lipid profile is  pending[JS.3]  4.  Sleep apnea on CPAP.  5.  Obesity: We discussed importance of weight reduction.[JS.2]    We discussed proceeding with a vascular screening evaluation.[JS.3]  I will see him back in 6 months.[JS.4]    Thank you for letting me partake in the care of your patient. If I can be of any further assistance do not hesitate to contact  me.      Sincerely,        Eda Keys MD, [facility]    --- Please note that I used Sales executive software to generate the above note. Occasionally words are mistranscribed despite my best efforts to edit errors. ---[JS.3]         (RETURN OFFICE VISIT)[DJ.1]               Electronically signed by Eda Keys., MD on 11/17/15 [phone].   Attribution Key    DJ.1 - [address], [place], [place] on 11/17/15 9604  JS.1 - Eda Keys., MD on 11/17/15 [phone]  JS.2 - Eda Keys., MD on 11/17/15 8119  JS.3 - Eda Keys., MD on 11/17/15 [phone]  JS.4 - Eda Keys., MD on 11/17/15 2956            Revision History      Date/Time User Provider Type Action   > 11/17/15 0911 Eda Keys., MD Physician Sign    11/17/15 0859 Glenice Laine, [facility] Medical Assistant Sign at close encounter

## 2016-05-24 NOTE — Unmapped (Addendum)
Progress Notes by Eda Keys., MD at 05/24/16 0900     Author:  Eda Keys., MD Service:  (none) Author Type:  Physician    Filed:  05/24/16 0934 Encounter Date:  05/24/2016 Status:  Signed    Editor:  Eda Keys., MD (Physician)         Subjective    John Chapman  [birth year]  Chief Complaint    Patient presents with    ??????? CAD   ??????? HTN[JS.1]           I had the pleasure of seeing your patient John Chapman????in Follow-up in the office on May 24, 2016. As you know, John Chapman is a 66 year old male with a history of hypertension, dyslipidemia and family history of coronary disease who underwent a   screening coronary calcium score in July 2017.  ????The total  was 130 ????In the LAD territory. ????  ????[JS.2]    He underwent a vascular screening evaluation in January.  This demonstrated moderate plaque in the left internal carotid artery with normal velocities.  There was no evidence of a AAA, ABIs were normal.    Patient's Medications     New Prescriptions      ATORVASTATIN (LIPITOR) 80 MG TABLET    Take 1 Tab by mouth nightly at bedtime.       Order Dose: 80 mg    Current Medications      ALLOPURINOL (ZYLOPRIM) 300 MG TABLET    Take 1 Tab by mouth daily.       Order Dose: 300 mg     ASPIRIN 81 MG TABLET, DELAYED RELEASE (E.C.)    Take 81 mg by mouth daily.       Order Dose: 81 mg     LISINOPRIL-HYDROCHLOROTHIAZIDE (PRINZIDE, ZESTORETIC) 10-12.5 MG PER TABLET    Take 1 Tab by mouth daily.       Order Dose: 1 Tab     MULTIVITAMIN PO    Take  by mouth daily.       Order Dose: --        Discontinued Medications      ATORVASTATIN (LIPITOR) 40 MG TABLET    Take 1 Tab by mouth daily.       Notes: --[JS.1]            ROS    Review of Systems   Constitutional: Negative.    HENT: Negative.    Eyes: Negative.    Respiratory: Negative.    Cardiovascular: Negative.    Gastrointestinal: Negative.    Genitourinary: Negative.    Musculoskeletal: Negative.    Skin: Negative.    Neurological: Negative.     Endo/Heme/Allergies: Negative.    Psychiatric/Behavioral: Negative.[DJ.1]            Objective    Vitals:     05/24/16 0918   BP: 138/90   Pulse: 55   Weight: 276 lb (125.2 kg)   Height: 5' 11 (1.803 m)     Body mass index is 38.49 kg/m????.[JS.1]    Physical Exam   Constitutional: He is[DJ.1] oriented to person, place, and time[JS.1] and[DJ.1] well-developed, well-nourished, and in no distress[JS.1].   HENT:   Head:[DJ.1] Normocephalic[JS.1] and[DJ.1] atraumatic[JS.1].   Neck:[DJ.1] Normal range of motion[JS.1].[DJ.1] Neck supple[JS.1].[DJ.1] No thyromegaly[JS.1] present.   Cardiovascular:[DJ.1] Normal rate[JS.1],[DJ.1] regular rhythm[JS.1],[DJ.1] normal heart sounds[JS.1] and[DJ.1] intact distal pulses[JS.1].    Pulmonary/Chest:[DJ.1] Effort normal[JS.1] and[DJ.1] breath sounds normal[JS.1]. He has[DJ.1] no wheezes[JS.1]. He has[DJ.1] no rales[JS.1].  Abdominal:[DJ.1] Soft[JS.1].[DJ.1] Bowel sounds are normal[JS.1]. He exhibits[DJ.1] no distension[JS.1]. There is[DJ.1] no tenderness[JS.1].   Musculoskeletal:[DJ.1] Normal range of motion[JS.1]. He exhibits no[DJ.1] edema[JS.1].   Lymphadenopathy:     He has[DJ.1] no cervical adenopathy[JS.1].   Neurological: He is[DJ.1] alert[JS.1] and[DJ.1] oriented to person, place, and time[JS.1].[DJ.1] Gait[JS.1] normal.   Skin: Skin is[DJ.1] warm[JS.1] and[DJ.1] dry[JS.1].   Psychiatric:[DJ.1] Affect[JS.1] normal.         IMPRESSION:[DJ.1]      ICD-10-CM    1. Coronary artery disease involving native coronary artery of native heart without angina pectoris I25.10    2. Benign essential HTN I10    3. Dyslipidemia E78.5    4. OSA on CPAP - [ Dr. Clearnce Sorrel, et.al. ] G47.33      Z99.89    5. Obesity (BMI 30-39.9) E66.9    6. Left-sided carotid artery disease (CMS HCC) - [ 2018 ] I77.9[JS.1]            PLAN AND RECOMMENDATION:[DJ.1]    1. ????Coronary artery disease: By virtue of a mildly abnormal calcium score of 130. ????Continue risk factor modification and medical therapy[JS.2]  .[JS.1]  2. ????Hypertension:[JS.2] Blood pressure is mildly elevated this morning.  he feels this is due to the fact that he  was late for the appointment and got lost.  I have given him a log to every track of his blood pressures at home.[JS.1]  3. ????Dyslipidemia:[JS.2] I will increase Lipitor to 80 mg a day.[JS.1]  4. ????Sleep apnea on CPAP.  5. ????Obesity: We discussed importance of weight reduction.[JS.2]    I will see him back in one month.  Thank you for letting me partake in the care of your patient. If I can be of any further assistance do not hesitate to contact  me.      Sincerely,        Eda Keys MD, [facility]    --- Please note that I used Sales executive software to generate the above note. Occasionally words are mistranscribed despite my best efforts to edit errors. ---[JS.1]         Electronically signed by Eda Keys., MD on 05/24/16 [phone].   Attribution Key    DJ.1 - [address] [place], [place] on 05/24/16 9604  JS.1 - Eda Keys., MD on 05/24/16 5409  JS.2 - Eda Keys., MD on 05/24/16 8119          Revision History      Date/Time User Provider Type Action   > 05/24/16 0934 Eda Keys., MD Physician Sign    05/24/16 0919 Glenice Laine, [facility] Medical Assistant Sign at close encounter

## 2016-07-27 NOTE — Unmapped (Addendum)
Progress Notes by Eda Keys., MD at 07/27/16 1610     Author:  Eda Keys., MD Service:  (none) Author Type:  Physician    Filed:  07/27/16 0919 Encounter Date:  07/27/2016 Status:  Signed    Editor:  Eda Keys., MD (Physician)         Subjective    John Chapman  27-Feb-1950  Chief Complaint    Patient presents with    ??????? CAD[JS.1]        I had the pleasure of seeing your patient John Chapman????in Follow-up????in the office on July 27, 2016. As you know, John Chapman is a 66 year old male with a history of hypertension, dyslipidemia and family history of coronary disease who underwent a   screening coronary calcium score in July 2017. ????????The total ????was 130 ????In the LAD territory. ????  ????  ????He underwent a vascular screening evaluation in January.  This demonstrated moderate plaque in the left internal carotid artery with normal velocities.  There was no evidence of a AAA, ABIs were normal.    He presents today for further evaluation of his blood pressure.  He did bring in a log of his blood pressures and for the most part they're running in the 120 range at home.[JS.2]    Overall he's doing well without complaints.    Patient's Medications     Current Medications      ALLOPURINOL (ZYLOPRIM) 300 MG TABLET    Take 1 Tab by mouth daily.       Order Dose: 300 mg     ASPIRIN 81 MG TABLET, DELAYED RELEASE (E.C.)    Take 81 mg by mouth daily.       Order Dose: 81 mg     ATORVASTATIN (LIPITOR) 80 MG TABLET    Take 1 Tab by mouth nightly at bedtime.       Order Dose: 80 mg     LISINOPRIL-HYDROCHLOROTHIAZIDE (PRINZIDE, ZESTORETIC) 10-12.5 MG PER TABLET    Take 1 Tab by mouth daily.       Order Dose: 1 Tab     MULTIVITAMIN PO    Take  by mouth daily.       Order Dose: --[JS.1]                ROS    Review of Systems   Constitutional: Negative.    HENT: Negative.    Eyes: Negative.    Respiratory: Negative.    Cardiovascular: Negative.    Gastrointestinal: Negative.    Genitourinary: Negative.     Musculoskeletal: Negative.    Skin: Negative.    Neurological: Negative.    Endo/Heme/Allergies: Negative.    Psychiatric/Behavioral: Negative.[DJ.1]            Objective    Vitals:     07/27/16 0906   BP: 136/88   Pulse: 65   Weight: 274 lb (124.3 kg)   Height: 5' 11 (1.803 m)     Body mass index is 38.22 kg/m????.[JS.1]    Physical Exam   Constitutional: He is[DJ.1] oriented to person, place, and time[JS.1] and[DJ.1] well-developed, well-nourished, and in no distress[JS.1].   HENT:   Head:[DJ.1] Normocephalic[JS.1] and[DJ.1] atraumatic[JS.1].   Neck:[DJ.1] Normal range of motion[JS.1].[DJ.1] Neck supple[JS.1].[DJ.1] No thyromegaly[JS.1] present.   Cardiovascular:[DJ.1] Normal rate[JS.1],[DJ.1] regular rhythm[JS.1],[DJ.1] normal heart sounds[JS.1] and[DJ.1] intact distal pulses[JS.1].    Pulmonary/Chest:[DJ.1] Effort normal[JS.1] and[DJ.1] breath sounds normal[JS.1]. He has[DJ.1] no wheezes[JS.1]. He has[DJ.1] no rales[JS.1].   Abdominal:[DJ.1] Soft[JS.1].[DJ.1] Bowel sounds  are normal[JS.1]. He exhibits[DJ.1] no distension[JS.1]. There is[DJ.1] no tenderness[JS.1].   Musculoskeletal:[DJ.1] Normal range of motion[JS.1]. He exhibits no[DJ.1] edema[JS.1].   Lymphadenopathy:     He has[DJ.1] no cervical adenopathy[JS.1].   Neurological: He is[DJ.1] alert[JS.1] and[DJ.1] oriented to person, place, and time[JS.1].[DJ.1] Gait[JS.1] normal.   Skin: Skin is[DJ.1] warm[JS.1] and[DJ.1] dry[JS.1].   Psychiatric:[DJ.1] Affect[JS.1] normal.         IMPRESSION:[DJ.1]      ICD-10-CM    1. Coronary artery disease involving native coronary artery of native heart without angina pectoris I25.10    2. Benign essential HTN I10    3. Dyslipidemia E78.5    4. OSA on CPAP - [ Dr. Clearnce Sorrel, et.al. ] G47.33      Z99.89    5. Obesity (BMI 30-39.9) E66.9[JS.1]            PLAN AND RECOMMENDATION:[DJ.1]      1. ????Coronary artery disease: By virtue of a mildly abnormal calcium score of 130. ????Continue risk factor modification and medical therapy  .  2. ????Hypertension: Blood pressure is[JS.2] slightly high in the office in the 120 range at home.  I've made no changes[JS.1]  3. ????Dyslipidemia:[JS.2] A lipid profile last month demonstrated a total cholesterol 145 with LDL of 76.  We discussed importance of a low fat low cholesterol weight reduction diet.[JS.1]  4. ????Sleep apnea on CPAP.  5. ????Obesity: We discussed importance of weight reduction[JS.2]      I will see him back in 6 months.  Thank you for letting me partake in the care of your patient. If I can be of any further assistance do not hesitate to contact  me.      Sincerely,        Eda Keys MD, [facility]    --- Please note that I used Sales executive software to generate the above note. Occasionally words are mistranscribed despite my best efforts to edit errors. ---[JS.1]      Electronically signed by Eda Keys., MD on 07/27/16 [phone].   Attribution Key    DJ.1 - [address], [place], [place] on 07/27/16 9604  JS.1 - Eda Keys., MD on 07/27/16 [phone]  JS.2 - Eda Keys., MD on 07/27/16 8119          Revision History      Date/Time User Provider Type Action   > 07/27/16 0919 Eda Keys., MD Physician Sign    07/27/16 0908 Glenice Laine, [facility] Medical Assistant Sign at close encounter

## 2017-03-14 NOTE — Unmapped (Addendum)
Progress Notes by Eda Keys., MD at 03/14/17 0900     Author:  Eda Keys., MD Service:  (none) Author Type:  Physician    Filed:  03/14/17 0906 Encounter Date:  03/14/2017 Status:  Signed    Editor:  Eda Keys., MD (Physician)         Subjective    John Chapman  Jun 12, 1950  Chief Complaint    Patient presents with    ??????? CAD[JS.1]           I had the pleasure of seeing your patient John Chapman????in Follow-up????in the office on March 14, 2017. As you know, John Chapman is a 67 year old male with a history of hypertension, dyslipidemia and family history of coronary disease who underwent a   screening coronary calcium score in July 2017. ????????The total ????was 130 ????In the LAD territory. ????  ????  ????He underwent a vascular screening evaluation in January 2018. ????This demonstrated moderate plaque in the left internal carotid artery with normal velocities. ????There was no evidence of a AAA, ABIs were normal.  ????[JS.2]  He presents today for further evaluation.  Unfortunately he has gained 10 pounds since our last visit.  His blood pressure is borderline to mildly elevated.[JS.3]      Patient's Medications     New Prescriptions      HYDROCHLOROTHIAZIDE (MICROZIDE) 12.5 MG CAPSULE    Take 1 Cap by mouth daily.       Order Dose: 12.5 mg     LISINOPRIL (PRINIVIL, ZESTRIL) 20 MG TABLET    Take 1 Tab by mouth daily.       Order Dose: 20 mg    Current Medications      ALLOPURINOL (ZYLOPRIM) 300 MG TABLET    Take 1 Tab by mouth daily.       Order Dose: 300 mg     ASPIRIN 81 MG TABLET, DELAYED RELEASE (E.C.)    Take 81 mg by mouth daily.       Order Dose: 81 mg     ATORVASTATIN (LIPITOR) 80 MG TABLET    Take 1 Tab by mouth nightly at bedtime.       Order Dose: 80 mg     MULTIVITAMIN PO    Take  by mouth daily.       Order Dose: --        Discontinued Medications      LISINOPRIL-HYDROCHLOROTHIAZIDE (PRINZIDE, ZESTORETIC) 10-12.5 MG PER TABLET    Take 1 Tab by mouth daily.       Notes: --[JS.1]            ROS     Review of Systems   Constitutional: Negative.    HENT: Negative.    Eyes: Negative.    Respiratory: Negative.    Cardiovascular: Negative.    Gastrointestinal: Negative.    Genitourinary: Negative.    Musculoskeletal: Negative.    Skin: Negative.    Neurological: Negative.    Endo/Heme/Allergies: Negative.    Psychiatric/Behavioral: Negative.[DJ.1]            Objective    Vitals:     03/14/17 0849   BP: 130/90   Pulse: 74   Weight: 284 lb (128.8 kg)   Height: 5' 11 (1.803 m)     Body mass index is 39.61 kg/m????.[JS.1]    Physical Exam   Constitutional: He is[DJ.1] oriented to person, place, and time[JS.3] and[DJ.1] well-developed, well-nourished, and in no distress[JS.3].   HENT:  Head:[DJ.1] Normocephalic[JS.3] and[DJ.1] atraumatic[JS.3].   Neck:[DJ.1] Normal range of motion[JS.3].[DJ.1] Neck supple[JS.3].[DJ.1] No thyromegaly[JS.3] present.   Cardiovascular:[DJ.1] Normal rate[JS.3],[DJ.1] regular rhythm[JS.3],[DJ.1] normal heart sounds[JS.3] and[DJ.1] intact distal pulses[JS.3].    Pulmonary/Chest:[DJ.1] Effort normal[JS.3] and[DJ.1] breath sounds normal[JS.3]. He has[DJ.1] no wheezes[JS.3]. He has[DJ.1] no rales[JS.3].   Abdominal:[DJ.1] Soft[JS.3].[DJ.1] Bowel sounds are normal[JS.3]. He exhibits[DJ.1] no distension[JS.3]. There is[DJ.1] no tenderness[JS.3].   Musculoskeletal:[DJ.1] Normal range of motion[JS.3]. He exhibits no[DJ.1] edema[JS.3].   Lymphadenopathy:     He has[DJ.1] no cervical adenopathy[JS.3].   Neurological: He is[DJ.1] alert[JS.3] and[DJ.1] oriented to person, place, and time[JS.3].[DJ.1] Gait[JS.3] normal.   Skin: Skin is[DJ.1] warm[JS.3] and[DJ.1] dry[JS.3].   Psychiatric:[DJ.1] Affect[JS.3] normal.         IMPRESSION:[DJ.1]      ICD-10-CM    1. Coronary artery disease involving native coronary artery of native heart without angina pectoris I25.10    2. Benign essential HTN I10 BASIC METABOLIC PANEL (BMP=EP1)   3. Dyslipidemia E78.5    4. OSA on CPAP - [ Dr. Clearnce Sorrel, et.al.  ] G47.33      Z99.89    5. Obesity (BMI 30-39.9) E66.9[JS.1]            PLAN AND RECOMMENDATION:[DJ.1]      1. ????Coronary artery disease: By virtue of a mildly abnormal calcium score of 130. ????Continue risk factor modification and medical therapy .  2. ????Hypertension: Blood pressure is[JS.2] borderline to mildly elevated.  This is likely due in part to weight gain.  He also reports that he's been eating a diet high in sodium.  We discussed importance of a low sodium diet and given a copy of the DASH   diet.[JS.3]  3. ????Dyslipidemia: A lipid profile[JS.2] in June 2018[JS.3] demonstrated a total cholesterol 145 with LDL of 76.  We discussed importance of a low fat low cholesterol weight reduction diet.  4. ????Sleep apnea on CPAP.  5. ????Obesity: We discussed importance of weight reduction[JS.2]      We'll check a basic metabolic profile in 10-14 days.  I will see him back in 6 weeks.  Thank you for letting me partake in the care of your patient. If I can be of any further assistance do not hesitate to contact  me.      Sincerely,        Eda Keys MD, [facility]    --- Please note that I used Sales executive software to generate the above note. Occasionally words are mistranscribed despite my best efforts to edit errors. ---[JS.3]   ????[JS.2]        Electronically signed by Eda Keys., MD on 03/14/17 [phone].   Attribution Key    DJ.1 - [address] [place], [place] on 03/14/17 9604  JS.1 - Eda Keys., MD on 03/14/17 5409  JS.2 - Eda Keys., MD on 03/14/17 [phone]  JS.3 - Eda Keys., MD on 03/14/17 1478          Revision History      Date/Time User Provider Type Action   > 03/14/17 0906 Eda Keys., MD Physician Sign    03/14/17 0850 Glenice Laine, [facility] Medical Assistant Sign at close encounter

## 2017-03-25 LAB — BASIC METABOLIC PANEL
Anion Gap: 11 mmol/L (ref 5–13)
BUN/Creatinine Ratio: 18
BUN: 17 mg/dL (ref 7–25)
CO2: 22 mmol/L (ref 22–29)
Calcium: 9.8 mg/dL (ref 8.4–10.5)
Chloride: 103 mmol/L (ref 98–110)
Creatinine: 0.93 mg/dL (ref 0.50–1.30)
GFR MDRD Af Amer: 99 See Note
GFR MDRD Non Af Amer: 82 See Note
Glucose: 109 mg/dL (ref 70–99)
Potassium: 4.2 mmol/L (ref 3.5–5.1)
Sodium: 136 mmol/L (ref 135–146)

## 2017-04-26 NOTE — Unmapped (Addendum)
Progress Notes by Eda Keys., MD at 04/26/17 0900     Author:  Eda Keys., MD Service:  (none) Author Type:  Physician    Filed:  04/26/17 0902 Encounter Date:  04/26/2017 Status:  Signed    Editor:  Eda Keys., MD (Physician)         Subjective    John Chapman  07-01-1950  Chief Complaint    Patient presents with    ??????? CAD[JS.1]       I had the pleasure of seeing your patient John Chapman????in Follow-up????in the office on April 26, 2017. As you know, Mr. John Chapman is a 67 year old male with a history of hypertension, dyslipidemia and family history of coronary disease who underwent a   screening coronary calcium score in July 2017. ????????The total ????was 130 ????In the LAD territory. ????  ????  ????He underwent a vascular screening evaluation in January 2018. ????This demonstrated moderate plaque in the left internal carotid artery with normal velocities. ????There was no evidence of a AAA, ABIs were normal.  ????  In February lisinopril was increased to 20 mg a day.  A basic metabolic profile on March 1 was normal.  Since our last visit he is lost 8 pounds.      He presents today for further evaluation.[JS.2]        Patient's Medications     Current Medications      ALLOPURINOL (ZYLOPRIM) 300 MG TABLET    TAKE ONE TABLET BY MOUTH ONCE DAILY       Order Dose: --     ASPIRIN 81 MG TABLET, DELAYED RELEASE (E.C.)    Take 81 mg by mouth daily.       Order Dose: 81 mg     ATORVASTATIN (LIPITOR) 80 MG TABLET    TAKE 1 TABLET BY MOUTH NIGHTLY AT BEDTIME       Order Dose: --     HYDROCHLOROTHIAZIDE (MICROZIDE) 12.5 MG CAPSULE    Take 1 Cap by mouth daily.       Order Dose: 12.5 mg     LISINOPRIL (PRINIVIL, ZESTRIL) 20 MG TABLET    Take 1 Tab by mouth daily.       Order Dose: 20 mg     MULTIVITAMIN PO    Take  by mouth daily.       Order Dose: --[JS.1]                ROS    Review of Systems   Constitutional: Negative.    HENT: Negative.    Eyes: Negative.    Respiratory: Negative.    Cardiovascular: Negative.     Gastrointestinal: Negative.    Genitourinary: Negative.    Musculoskeletal: Negative.    Skin: Negative.    Neurological: Negative.    Endo/Heme/Allergies: Negative.    Psychiatric/Behavioral: Negative.[DJ.1]            Objective    Vitals:     04/26/17 0851   BP: 138/82   Pulse: 72   Weight: 276 lb (125.2 kg)   Height: 5' 11 (1.803 m)     Body mass index is 38.49 kg/m????.[JS.1]    Physical Exam   Constitutional: He is[DJ.1] oriented to person, place, and time[JS.1] and[DJ.1] well-developed, well-nourished, and in no distress[JS.1].   HENT:   Head:[DJ.1] Normocephalic[JS.1] and[DJ.1] atraumatic[JS.1].   Neck:[DJ.1] Normal range of motion[JS.1].[DJ.1] Neck supple[JS.1].[DJ.1] No thyromegaly[JS.1] present.   Cardiovascular:[DJ.1] Normal rate[JS.1],[DJ.1] regular rhythm[JS.1],[DJ.1] normal heart sounds[JS.1] and[DJ.1]  intact distal pulses[JS.1].    Pulmonary/Chest:[DJ.1] Effort normal[JS.1] and[DJ.1] breath sounds normal[JS.1]. He has[DJ.1] no wheezes[JS.1]. He has[DJ.1] no rales[JS.1].   Abdominal:[DJ.1] Soft[JS.1].[DJ.1] Bowel sounds are normal[JS.1]. He exhibits[DJ.1] no distension[JS.1]. There is[DJ.1] no tenderness[JS.1].   Musculoskeletal:[DJ.1] Normal range of motion[JS.1]. He exhibits no[DJ.1] edema[JS.1].   Lymphadenopathy:     He has[DJ.1] no cervical adenopathy[JS.1].   Neurological: He is[DJ.1] alert[JS.1] and[DJ.1] oriented to person, place, and time[JS.1].[DJ.1] Gait[JS.1] normal.   Skin: Skin is[DJ.1] warm[JS.1] and[DJ.1] dry[JS.1].   Psychiatric:[DJ.1] Affect[JS.1] normal.         IMPRESSION:[DJ.1]      ICD-10-CM    1. Coronary artery disease involving native coronary artery of native heart without angina pectoris I25.10    2. Benign essential HTN I10    3. Dyslipidemia E78.5    4. OSA on CPAP - [ Dr. Clearnce Sorrel, et.al. ] G47.33      Z99.89    5. Obesity (BMI 30-39.9) E66.9[JS.1]            PLAN AND RECOMMENDATION:[DJ.1]    ????????1. ????Coronary artery disease: By virtue of a mildly abnormal calcium score of  130. ????Continue risk factor modification and medical therapy .  2. ????Hypertension: Blood pressure is borderline to mildly elevated.[JS.2]  He  tells me that at home is running in the 120/70 range.  He is somewhat anxious.  He states that it is usually higher in the doctor's office.  As result I've made no changes   have asked him to continue to follow his blood pressures at home and contact me if they trend upwards.[JS.1]  3. ????Dyslipidemia: A lipid profile in June 2018 demonstrated a total cholesterol 145 with LDL of 76. ????We discussed importance of a low fat low cholesterol weight reduction diet.  4. ????Sleep apnea on CPAP.  5. ????Obesity: We discussed importance of[JS.2] continued[JS.1] weight reduction[JS.2].  He is lost 8 pounds since her last visit.    I will see him back in 6 months.  Thank you for letting me partake in the care of your patient. If I can be of any further assistance do not hesitate to contact  me.      Sincerely,        Eda Keys MD, [facility]    --- Please note that I used [employer] software to generate the above note. Occasionally words are mistranscribed despite my best efforts to edit errors. ---[JS.1]         Electronically signed by Eda Keys., MD on 04/26/17 [phone].   Attribution Key    DJ.1 - [address] [place], [place] on 04/26/17 [id number]  JS.1 - Eda Keys., MD on 04/26/17 5409  JS.2 - Eda Keys., MD on 04/26/17 [phone]          Revision History      Date/Time User Provider Type Action   > 04/26/17 0902 Eda Keys., MD Physician Sign    04/26/17 1478 Glenice Laine, [facility] Medical Assistant Sign at close encounter

## 2018-08-09 ENCOUNTER — Ambulatory Visit: Admit: 2018-08-09 | Discharge: 2018-08-09 | Payer: PRIVATE HEALTH INSURANCE

## 2018-08-09 DIAGNOSIS — H2513 Age-related nuclear cataract, bilateral: Secondary | ICD-10-CM

## 2018-08-09 MED ORDER — phenylephrine (MYDFRIN) 2.5 % ophthalmic solution 1 drop
2.5 | Freq: Once | OPHTHALMIC | Status: AC
Start: 2018-08-09 — End: 2018-08-10

## 2018-08-09 MED ORDER — proparacaine (ALCAINE) 0.5 % ophthalmic solution 1 drop
0.5 | Freq: Once | OPHTHALMIC | Status: AC
Start: 2018-08-09 — End: 2018-08-10

## 2018-08-09 MED ORDER — tropicamide (MYDRIACYL) 1 % ophthalmic solution 1 drop
1 | Freq: Once | OPHTHALMIC | Status: AC
Start: 2018-08-09 — End: 2018-08-10

## 2018-08-09 NOTE — Unmapped (Addendum)
Ophthalmology Clinic Note    Patient's Name: Reynald Woods, [birth year]  08/09/2018    SUBJECTIVE:  CC:   Chief Complaint   Patient presents with   ??? Eye Exam   ??? Cataract   ??? New pt visit       HPI:  Elena Cothern is a 68 y.o. White or Caucasian male who presents today for a full ocular examination. The patient notes that his contact lenses are about 67 years old at this time.  He really does not note glare at night.  He was thinking about bifocal contact lenses as he currently only wears monofocal contact lenses.      No results found for: HGBA1C      Allergies:   Allergies   Allergen Reactions   ??? Penicillins          ROS: Reviewed with no pertinent positives except as listed above    OBJECTIVE:  Base Eye Exam     Visual Acuity (Snellen - Linear)       Right Left    Dist cc 20/50 +2 20/30 +2    Dist ph cc 20/25 -2 20/20 -2    Correction:  Contacts          Tonometry (Tonopen, 11:18 AM)       Right Left    Pressure 18 18          Pupils       Dark Light Shape React APD    Right 5 2 Round +3 None    Left 5 2 Round +3 None          Visual Fields       Left Right     Full Full          Extraocular Movement       Right Left     Full Full          Neuro/Psych     Oriented x3:  Yes    Mood/Affect:  Normal          Dilation     Both eyes:  1.0% Mydriacyl, 2.5% Phenylephrine, 0.5% Proparacaine @ 11:19 AM            Slit Lamp and Fundus Exam     External Exam       Right Left    External Normal Normal          Slit Lamp Exam       Right Left    Lids/Lashes Normal Normal    Conjunctiva/Sclera Normal Normal    Cornea Normal Normal    Anterior Chamber Deep and clear Deep and clear    Iris Normal Normal    Lens 2-3+ ns with 1-2+ psc 1+ ns with early 1+ psc and non central inferior cs    Vitreous syneresis syneresis          Fundus Exam       Right Left    Disc Normal Normal    C/D Ratio 0.4 with tilt 0.3 with mild tilt    Macula Normal Normal    Vessels Normal Normal    Periphery Normal Normal            Refraction     Manifest  Refraction (Auto)       Sphere Cylinder Axis Dist VA    Right -11.25 +1.75 050 20/40-    Left -9.25 +1.25 120 20/30-  No images are attached to the encounter.    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  He changed the contact lens for OD and is doing very well!  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  He plans to hold on cataract surgery OD at the present time and will return to see me in three months or prn concerns.  He very much appreciated the calls and all questions answered.      RTC 3 months full dilated exam OU.  Patient was instructed to call and return to clinic if sudden change in vision or any other ocular concerns.        Roe Rutherford, MD  Ophthalmology  08/09/2018 12:04 PM

## 2018-11-06 NOTE — Unmapped (Deleted)
Ophthalmology Clinic Note    Patient's Name: John Chapman, [birth year]  10/25/2018    SUBJECTIVE:  CC: No chief complaint on file.      HPI:  John Chapman is a 67 y.o. White or Caucasian male who presents today for a full ocular examination.    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 Penicillins          ROS: Reviewed with no pertinent positives except as listed above    OBJECTIVE:   Not recorded          No images are attached to the encounter.    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        Roe Rutherford, MD  Ophthalmology  10/25/2018 9:07 AM

## 2018-11-06 NOTE — Progress Notes (Signed)
Patient chose to not stay for this visit.  I spoke to the patient by phone and he plans to continue his eye care in [place] after he moves.  He very much appreciated the call.

## 2023-01-21 IMAGING — MR MRI RIGHT SHOULDER WITHOUT CONTRAST
4 of 6 series · 23 of 40 positions shown · IV contrast (gadolinium)
Comparison: None

________________________________________________________________________________________________ 
MRI RIGHT SHOULDER WITHOUT CONTRAST, 01/21/2023 [DATE]: 
CLINICAL INDICATION: Pain. Weakness and strength testing of rotator cuff and 
biceps tendon injury. Patient states was chopping with a machete on 12/25/2022 
and felt a pop in the right shoulder.
TECHNIQUE: Multiplanar, multiecho position MR images of the shoulder were 
performed without intravenous gadolinium enhancement. Patient was scanned on a 
1.5T magnet.

[Series 201: survey right · axial · right · 10.0mm · 0.71mm/px · z∈[-40,+125]mm · 3 of 15 slices shown]
[im 1/15]
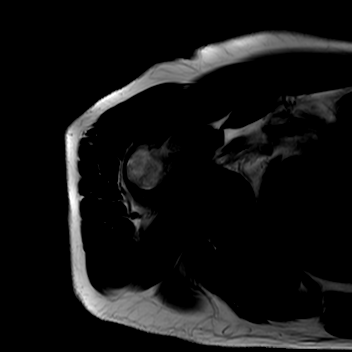
[im 8/15]
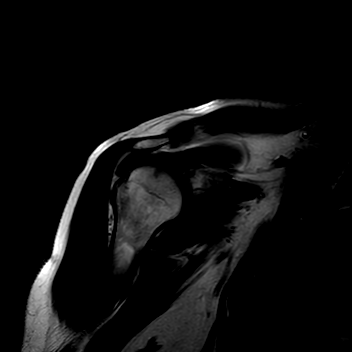
[im 15/15]
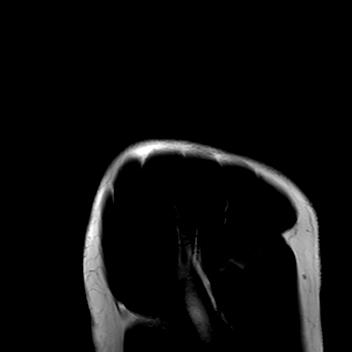

[Series 301: (person_name)_(person_name)_(person_name) right · axial · right · 3.5mm · 0.42mm/px · z∈[-89,+49]mm · 9 of 37 slices shown]
[im 1/37]
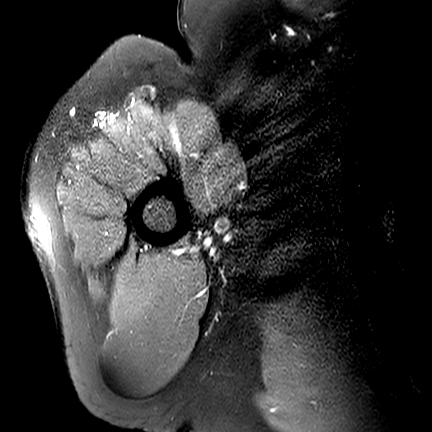
[im 5/37]
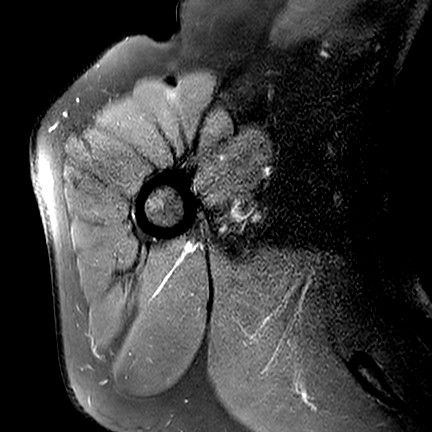
[im 10/37]
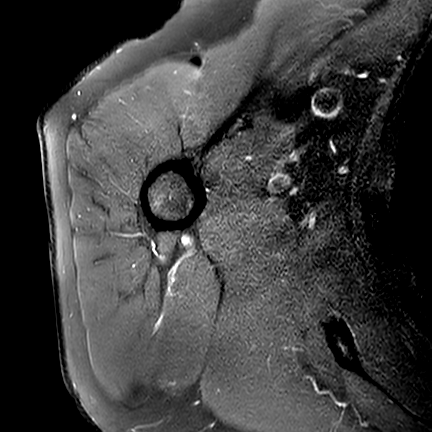
[im 14/37]
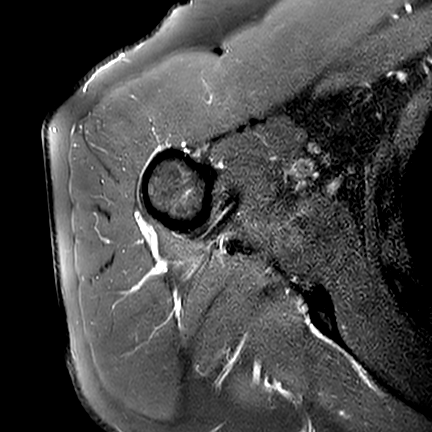
[im 19/37]
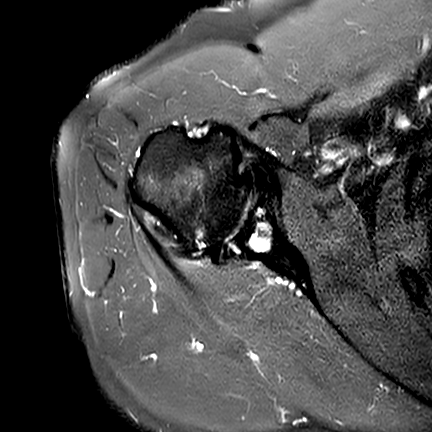
[im 23/37]
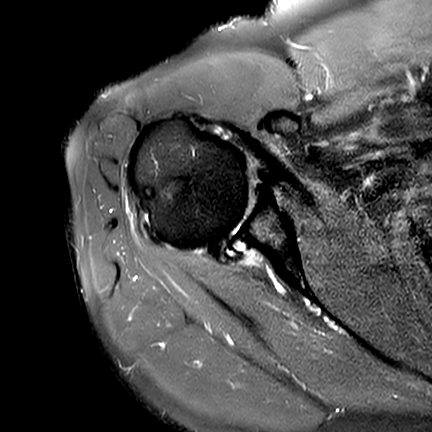
[im 28/37]
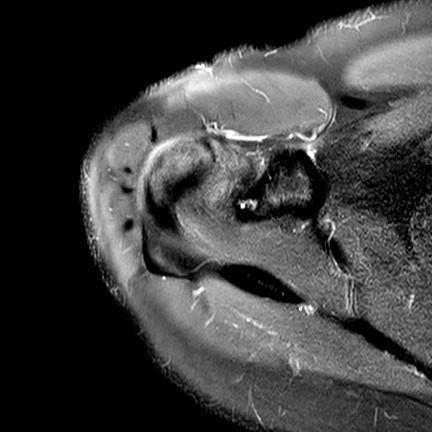
[im 32/37]
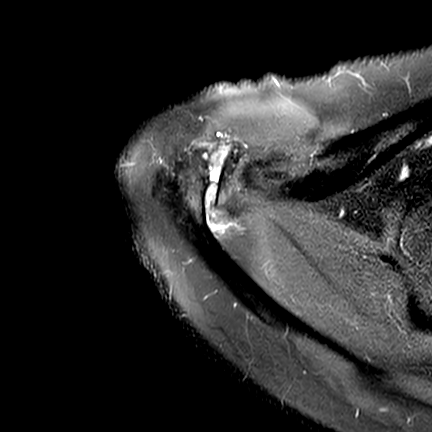
[im 37/37]
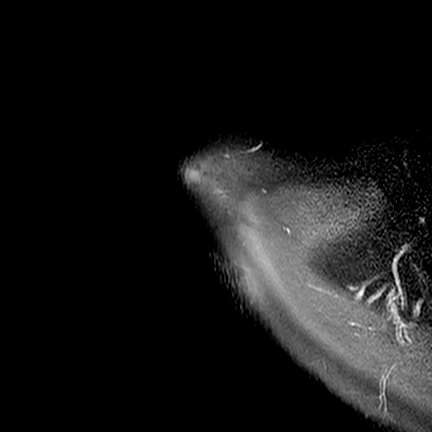

[Series 401: t2_fs_sag right · oblique · right · 3.5mm · 0.34mm/px · 8 of 30 slices shown]
[im 1/30]
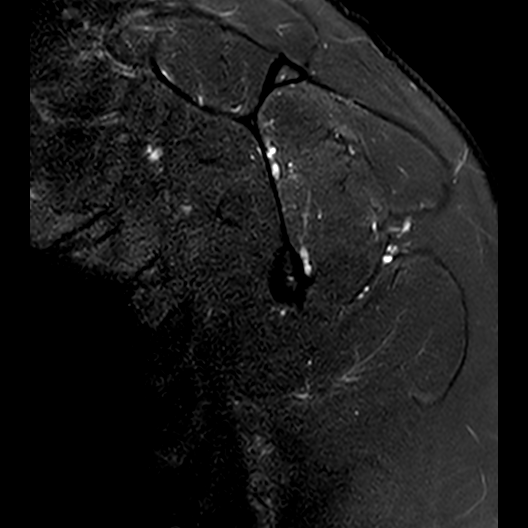
[im 5/30]
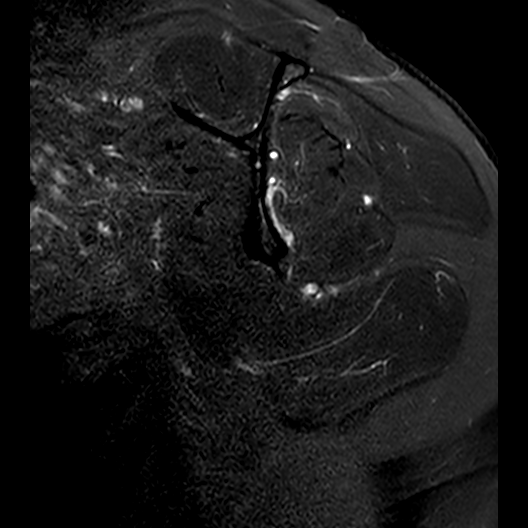
[im 9/30]
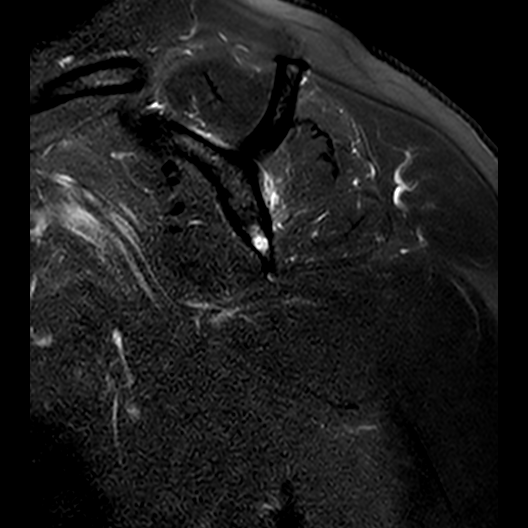
[im 13/30]
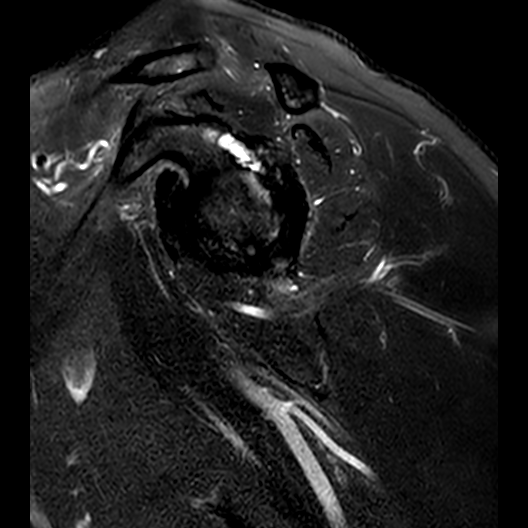
[im 17/30]
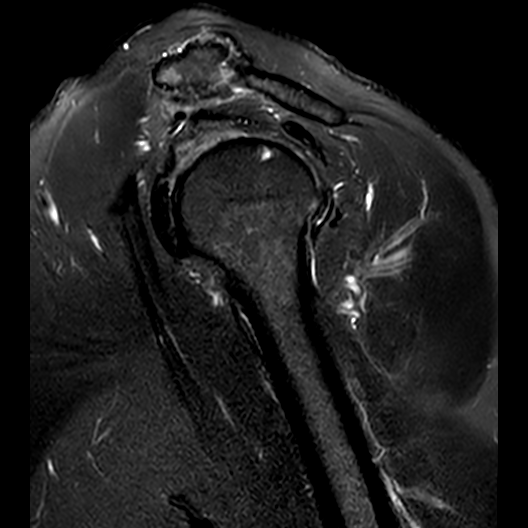
[im 21/30]
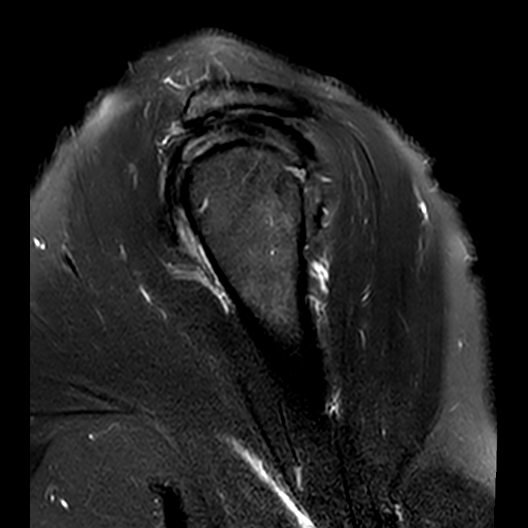
[im 25/30]
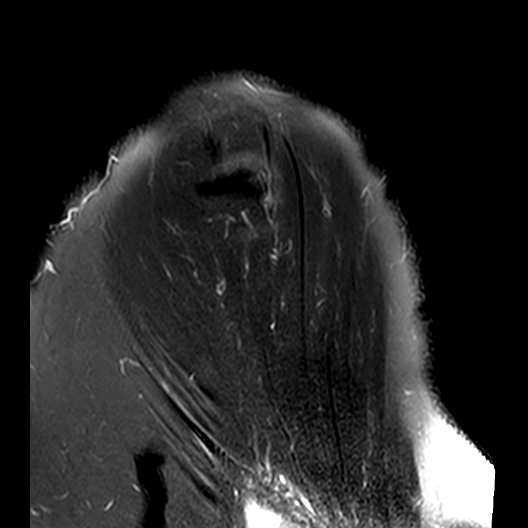
[im 30/30]
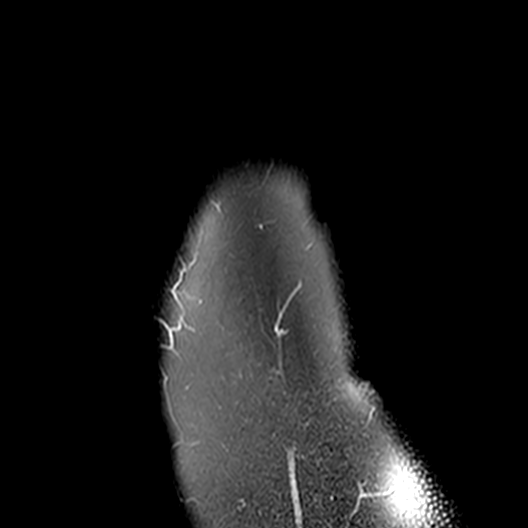

[Series 501: pd_fs_cor right · oblique · right · 3.5mm · 0.37mm/px · 3 of 24 slices shown]
[im 5/24]
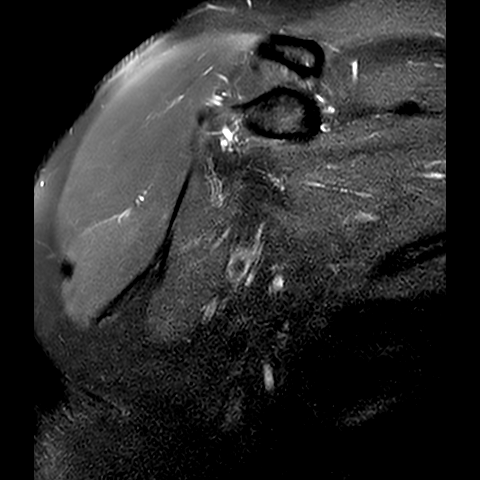
[im 14/24]
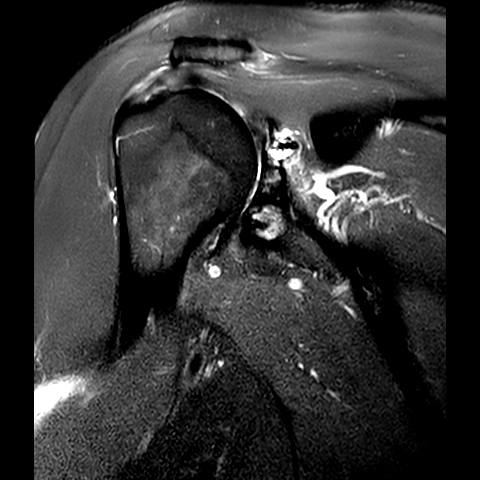
[im 24/24]
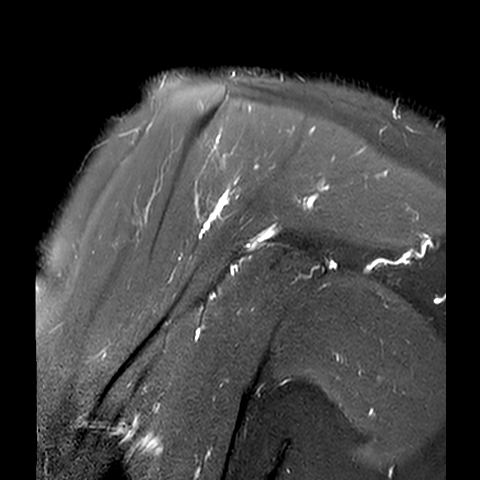

[23 of 40 positions shown; findings below may reference images not displayed]

FINDINGS: ROTATOR CUFF: There is high-grade articular surface tearing of the anterior 
insertion of the supraspinatus tendon measuring 8 mm AP. There is tendinosis of 
the subscapularis. The rotator cuff musculature is symmetric without mass, 
signal abnormality or atrophy. 
ACROMIOCLAVICULAR JOINT: Moderate hypertrophic changes of the acromioclavicular 
joint. There is mass effect upon the underlying myotendinous juncture of the 
supraspinatus. The coracoacromial ligament is intact without prominent spurring 
at the acromial attachment. The acromioclavicular and coracoclavicular ligaments 
are preserved. The acromium is normal in morphology. 
GLENOHUMERAL JOINT: There is advanced articular cartilaginous loss of the 
glenohumeral articulation. There is subcortical edema and cystic change 
involving the glenoid. Inferior osteophytic spurring of the humeral head. 
Multifocal degenerative tearing/maceration of the labrum. The intertubercular 
portion of the long head of the biceps tendon is not visualized compatible with 
full-thickness tear. 
ADDITIONAL FINDINGS: No fracture. No Hill-Sachs defect. The axillary region is 
negative. Subcutaneous tissues are negative.
IMPRESSION: 1.  Advanced degenerative changes of the glenohumeral articulation. 
2.  High-grade articular surface tear of the anterior supraspinatus insertion. 
3.  Full-thickness tear of the proximal long head of the biceps tendon. 
4.  Tendinosis of the subscapularis. 
5.  Moderate AC joint arthropathy with narrowing of the supraspinatus outlet.
